# Patient Record
Sex: Female | Born: 1965 | Race: White | Hispanic: No | Marital: Married | Smoking: Never smoker
Health system: Southern US, Community
[De-identification: ages and names within clinical notes are randomized; demographics above are authoritative.]

## PROBLEM LIST (undated history)

## (undated) DIAGNOSIS — E785 Hyperlipidemia, unspecified: Secondary | ICD-10-CM

## (undated) DIAGNOSIS — F419 Anxiety disorder, unspecified: Secondary | ICD-10-CM

## (undated) DIAGNOSIS — F32A Depression, unspecified: Secondary | ICD-10-CM

## (undated) DIAGNOSIS — M549 Dorsalgia, unspecified: Secondary | ICD-10-CM

## (undated) DIAGNOSIS — F329 Major depressive disorder, single episode, unspecified: Secondary | ICD-10-CM

## (undated) DIAGNOSIS — I10 Essential (primary) hypertension: Secondary | ICD-10-CM

## (undated) DIAGNOSIS — R87619 Unspecified abnormal cytological findings in specimens from cervix uteri: Secondary | ICD-10-CM

## (undated) DIAGNOSIS — I2510 Atherosclerotic heart disease of native coronary artery without angina pectoris: Secondary | ICD-10-CM

## (undated) HISTORY — DX: Unspecified abnormal cytological findings in specimens from cervix uteri: R87.619

## (undated) HISTORY — PX: ADENOIDECTOMY: SHX5191

## (undated) HISTORY — DX: Anxiety disorder, unspecified: F41.9

## (undated) HISTORY — DX: Hyperlipidemia, unspecified: E78.5

## (undated) HISTORY — DX: Essential (primary) hypertension: I10

---

## 1998-01-25 ENCOUNTER — Encounter: Admission: RE | Admit: 1998-01-25 | Discharge: 1998-04-25 | Payer: Self-pay | Admitting: *Deleted

## 1998-12-25 DIAGNOSIS — R87619 Unspecified abnormal cytological findings in specimens from cervix uteri: Secondary | ICD-10-CM

## 1998-12-25 HISTORY — DX: Unspecified abnormal cytological findings in specimens from cervix uteri: R87.619

## 1999-01-04 ENCOUNTER — Other Ambulatory Visit: Admission: RE | Admit: 1999-01-04 | Discharge: 1999-01-04 | Payer: Self-pay | Admitting: Obstetrics and Gynecology

## 1999-03-23 ENCOUNTER — Other Ambulatory Visit: Admission: RE | Admit: 1999-03-23 | Discharge: 1999-03-23 | Payer: Self-pay | Admitting: Obstetrics and Gynecology

## 2000-01-31 ENCOUNTER — Other Ambulatory Visit: Admission: RE | Admit: 2000-01-31 | Discharge: 2000-01-31 | Payer: Self-pay | Admitting: Obstetrics and Gynecology

## 2000-10-16 ENCOUNTER — Other Ambulatory Visit: Admission: RE | Admit: 2000-10-16 | Discharge: 2000-10-16 | Payer: Self-pay | Admitting: Obstetrics and Gynecology

## 2001-05-03 ENCOUNTER — Inpatient Hospital Stay (HOSPITAL_COMMUNITY): Admission: AD | Admit: 2001-05-03 | Discharge: 2001-05-03 | Payer: Self-pay | Admitting: Obstetrics and Gynecology

## 2001-05-05 ENCOUNTER — Inpatient Hospital Stay (HOSPITAL_COMMUNITY): Admission: AD | Admit: 2001-05-05 | Discharge: 2001-05-07 | Payer: Self-pay | Admitting: Obstetrics and Gynecology

## 2001-05-10 ENCOUNTER — Encounter: Admission: RE | Admit: 2001-05-10 | Discharge: 2001-06-09 | Payer: Self-pay | Admitting: Obstetrics and Gynecology

## 2001-07-09 ENCOUNTER — Other Ambulatory Visit: Admission: RE | Admit: 2001-07-09 | Discharge: 2001-07-09 | Payer: Self-pay | Admitting: Obstetrics and Gynecology

## 2002-09-10 ENCOUNTER — Other Ambulatory Visit: Admission: RE | Admit: 2002-09-10 | Discharge: 2002-09-10 | Payer: Self-pay | Admitting: Obstetrics and Gynecology

## 2003-09-16 ENCOUNTER — Other Ambulatory Visit: Admission: RE | Admit: 2003-09-16 | Discharge: 2003-09-16 | Payer: Self-pay | Admitting: Obstetrics and Gynecology

## 2004-09-27 ENCOUNTER — Other Ambulatory Visit: Admission: RE | Admit: 2004-09-27 | Discharge: 2004-09-27 | Payer: Self-pay | Admitting: Obstetrics and Gynecology

## 2005-10-26 ENCOUNTER — Encounter: Admission: RE | Admit: 2005-10-26 | Discharge: 2005-10-26 | Payer: Self-pay | Admitting: Obstetrics and Gynecology

## 2006-01-19 ENCOUNTER — Other Ambulatory Visit: Admission: RE | Admit: 2006-01-19 | Discharge: 2006-01-19 | Payer: Self-pay | Admitting: Obstetrics and Gynecology

## 2007-03-25 ENCOUNTER — Other Ambulatory Visit: Admission: RE | Admit: 2007-03-25 | Discharge: 2007-03-25 | Payer: Self-pay | Admitting: Obstetrics and Gynecology

## 2007-06-04 ENCOUNTER — Encounter: Admission: RE | Admit: 2007-06-04 | Discharge: 2007-06-04 | Payer: Self-pay | Admitting: Obstetrics and Gynecology

## 2008-09-07 ENCOUNTER — Emergency Department (HOSPITAL_COMMUNITY): Admission: EM | Admit: 2008-09-07 | Discharge: 2008-09-07 | Payer: Self-pay | Admitting: Emergency Medicine

## 2009-08-19 ENCOUNTER — Encounter: Admission: RE | Admit: 2009-08-19 | Discharge: 2009-08-19 | Payer: Self-pay | Admitting: Obstetrics and Gynecology

## 2010-12-05 ENCOUNTER — Encounter
Admission: RE | Admit: 2010-12-05 | Discharge: 2010-12-05 | Payer: Self-pay | Source: Home / Self Care | Attending: Obstetrics and Gynecology | Admitting: Obstetrics and Gynecology

## 2011-01-14 ENCOUNTER — Encounter: Payer: Self-pay | Admitting: Obstetrics and Gynecology

## 2011-01-15 ENCOUNTER — Encounter: Payer: Self-pay | Admitting: Obstetrics and Gynecology

## 2011-01-16 ENCOUNTER — Encounter: Payer: Self-pay | Admitting: Obstetrics and Gynecology

## 2011-05-12 NOTE — H&P (Signed)
[facility]  Patient:    Gabrielle Ibarra, Gabrielle Ibarra                     MRN: [id number] Adm. Date:  [id number] Disc. Date: [id number] Attending:  Maxie Better                         History and Physical  CHIEF COMPLAINT:                Favorable cervix for induction.  HISTORY OF PRESENT ILLNESS:     [age]-year-old white female G3, P2, EDD May 16, 2001, at 76 + weeks with advanced cervical dilatation for AROM induction.  ALLERGIES:                      SULFA.  MEDICATIONS:                    Prenatal vitamins.  PAST OBSTETRIC HISTORY:         In 1996, 9 pound 6 ounce female delivered by primary cesarean section for CPD; 1998 successful VBAC 8 pound 5 ounce female without complications.  PAST MEDICAL HISTORY:           Otherwise remarkable for an abnormal Pap smear with follow-up normal.  History of GBS positivity with second pregnancy. Strong family history of breast cancer, emphysema, superficial thrombophlebitis and hypertension.  PRENATAL LABORATORY DATA:       Remarkable for GBS negativity.  Blood type is O positive, Rh antibody negative.  VDRL nonreactive.  Rubella immune. Hepatitis B surface antigen negative.  HIV nonreactive.  PRENATAL COURSE:                Complicated by preterm cervical change.  PHYSICAL EXAMINATION:  GENERAL:                        She is a well-developed, well-nourished white female in no apparent distress.  HEENT:                          Normal.  LUNGS:                          Clear.  HEART:                          Regular rhythm.  ABDOMEN:                         Soft, gravid and nontender.  Estimated fetal weight on ultrasound 10 days ago 7 pounds 2 ounces.  PELVIC:                         Cervix is 4 cm, 50%, vertex, -2.  Artificial rupture of membranes clear.  EXTREMITIES:                    Intact.  NEUROLOGICAL:                   Exam is nonfocal.  IMPRESSION:                     1.  Intrauterine pregnancy at 38+ weeks.  2. Advanced cervical dilatation.                                 3. Previous cesarean section; desires vaginal                                    birth after cesarean.  PLAN:                           Risks, benefits of repeat C-section, a 1-3% risk of uterine scar dehiscence, up to 5% with Pitocin augmentation is discussed with the patient.  She acknowledges and desires to proceed with attempts at [facility].  History of group beta strep positivity with group beta strep negative this pregnancy.  The patient is offered treatment versus expectant management due to GBS negative treatment.  She desires no antibiotic treatment.  Advanced maternal age; declined amniocentesis.  Artificial rupture of membranes, Pitocin p.r.n.  Anticipate attempt at vaginal delivery.  Epidural as needed. DD:  05/05/01 TD:  05/05/01 Job: [id number]

## 2011-09-27 LAB — COMPREHENSIVE METABOLIC PANEL
ALT: 16
AST: 20
Albumin: 4.2
Alkaline Phosphatase: 62
BUN: 12
CO2: 26
Calcium: 9.6
Chloride: 102
Creatinine, Ser: 0.65
GFR calc Af Amer: >60
GFR calc non Af Amer: >60
Glucose, Bld: 105 — ABNORMAL HIGH
Potassium: 3.9
Sodium: 136
Total Bilirubin: 1
Total Protein: 7.1

## 2011-09-27 LAB — URINE MICROSCOPIC-ADD ON

## 2011-09-27 LAB — CK TOTAL AND CKMB (NOT AT ARMC)
CK, MB: 1.1
Relative Index: INVALID
Total CK: 92

## 2011-09-27 LAB — PREGNANCY, URINE: Preg Test, Ur: NEGATIVE

## 2011-09-27 LAB — CBC
HCT: 41.6
Hemoglobin: 14.1
MCHC: 33.9
MCV: 89.9
Platelets: 251
RBC: 4.62
RDW: 12.1
WBC: 9.1

## 2011-09-27 LAB — DIFFERENTIAL
Basophils Absolute: 0
Basophils Relative: 0
Eosinophils Absolute: 0
Eosinophils Relative: 1
Lymphocytes Relative: 11 — ABNORMAL LOW
Lymphs Abs: 1
Monocytes Absolute: 0.6
Monocytes Relative: 6
Neutro Abs: 7.4
Neutrophils Relative %: 82 — ABNORMAL HIGH

## 2011-09-27 LAB — TROPONIN I: Troponin I: 0.02

## 2011-09-27 LAB — URINALYSIS, ROUTINE W REFLEX MICROSCOPIC
Bilirubin Urine: NEGATIVE
Glucose, UA: NEGATIVE
Hgb urine dipstick: NEGATIVE
Ketones, ur: NEGATIVE
Nitrite: NEGATIVE
Protein, ur: NEGATIVE
Specific Gravity, Urine: 1.021
Urobilinogen, UA: 0.2
pH: 7

## 2011-12-26 DIAGNOSIS — I10 Essential (primary) hypertension: Secondary | ICD-10-CM

## 2011-12-26 HISTORY — DX: Essential (primary) hypertension: I10

## 2012-02-12 ENCOUNTER — Other Ambulatory Visit: Payer: Self-pay | Admitting: Obstetrics and Gynecology

## 2012-02-12 DIAGNOSIS — Z1231 Encounter for screening mammogram for malignant neoplasm of breast: Secondary | ICD-10-CM

## 2012-02-14 ENCOUNTER — Ambulatory Visit
Admission: RE | Admit: 2012-02-14 | Discharge: 2012-02-14 | Disposition: A | Discharge status: Home / Self Care | Payer: BC Managed Care – PPO | Source: Ambulatory Visit | Attending: Obstetrics and Gynecology | Admitting: Obstetrics and Gynecology

## 2012-02-14 DIAGNOSIS — Z1231 Encounter for screening mammogram for malignant neoplasm of breast: Secondary | ICD-10-CM

## 2012-04-03 HISTORY — PX: INTRAUTERINE DEVICE INSERTION: SHX323

## 2012-04-11 ENCOUNTER — Emergency Department (HOSPITAL_COMMUNITY): Payer: BC Managed Care – PPO

## 2012-04-11 ENCOUNTER — Encounter (HOSPITAL_COMMUNITY): Payer: Self-pay | Admitting: *Deleted

## 2012-04-11 ENCOUNTER — Emergency Department (HOSPITAL_COMMUNITY)
Admission: EM | Admit: 2012-04-11 | Discharge: 2012-04-11 | Disposition: A | Discharge status: Home / Self Care | Payer: BC Managed Care – PPO | Attending: Emergency Medicine | Admitting: Emergency Medicine

## 2012-04-11 DIAGNOSIS — F3289 Other specified depressive episodes: Secondary | ICD-10-CM | POA: Insufficient documentation

## 2012-04-11 DIAGNOSIS — R5381 Other malaise: Secondary | ICD-10-CM | POA: Insufficient documentation

## 2012-04-11 DIAGNOSIS — R5383 Other fatigue: Secondary | ICD-10-CM | POA: Insufficient documentation

## 2012-04-11 DIAGNOSIS — R61 Generalized hyperhidrosis: Secondary | ICD-10-CM | POA: Insufficient documentation

## 2012-04-11 DIAGNOSIS — F329 Major depressive disorder, single episode, unspecified: Secondary | ICD-10-CM | POA: Insufficient documentation

## 2012-04-11 DIAGNOSIS — R51 Headache: Secondary | ICD-10-CM | POA: Insufficient documentation

## 2012-04-11 DIAGNOSIS — R079 Chest pain, unspecified: Secondary | ICD-10-CM | POA: Insufficient documentation

## 2012-04-11 DIAGNOSIS — R0602 Shortness of breath: Secondary | ICD-10-CM | POA: Insufficient documentation

## 2012-04-11 DIAGNOSIS — Z79899 Other long term (current) drug therapy: Secondary | ICD-10-CM | POA: Insufficient documentation

## 2012-04-11 DIAGNOSIS — R000 Tachycardia, unspecified: Secondary | ICD-10-CM | POA: Insufficient documentation

## 2012-04-11 HISTORY — DX: Depression, unspecified: F32.A

## 2012-04-11 HISTORY — DX: Dorsalgia, unspecified: M54.9

## 2012-04-11 HISTORY — DX: Major depressive disorder, single episode, unspecified: F32.9

## 2012-04-11 LAB — POCT I-STAT, CHEM 8
Creatinine, Ser: 0.7 mg/dL (ref 0.50–1.10)
Glucose, Bld: 108 mg/dL — ABNORMAL HIGH (ref 70–99)
HCT: 42 % (ref 36.0–46.0)
Hemoglobin: 14.3 g/dL (ref 12.0–15.0)
Sodium: 141 mEq/L (ref 135–145)
TCO2: 25 mmol/L (ref 0–100)

## 2012-04-11 LAB — CBC
HCT: 42 % (ref 36.0–46.0)
Hemoglobin: 13.8 g/dL (ref 12.0–15.0)
MCH: 30.1 pg (ref 26.0–34.0)
MCV: 91.5 fL (ref 78.0–100.0)
Platelets: 246 10*3/uL (ref 150–400)
RBC: 4.59 MIL/uL (ref 3.87–5.11)
RDW: 12.1 % (ref 11.5–15.5)
WBC: 6.1 10*3/uL (ref 4.0–10.5)

## 2012-04-11 MED ORDER — ASPIRIN 81 MG PO CHEW
324.0000 mg | CHEWABLE_TABLET | Freq: Once | ORAL | Status: AC
  Administered 2012-04-11: 324 mg via ORAL
  Filled 2012-04-11: qty 4

## 2012-04-11 MED ORDER — SODIUM CHLORIDE 0.9 % IV SOLN: 20.0000 mL | INTRAVENOUS | Status: DC

## 2012-04-11 MED ORDER — OMEPRAZOLE 20 MG PO CPDR: 40.0000 mg | DELAYED_RELEASE_CAPSULE | Freq: Every day | ORAL | Status: DC

## 2012-04-11 MED ORDER — GI COCKTAIL ~~LOC~~
30.0000 mL | Freq: Once | ORAL | Status: AC
  Administered 2012-04-11: 30 mL via ORAL
  Filled 2012-04-11: qty 30

## 2012-04-11 NOTE — ED Notes (Signed)
Reports having 2 glasses of wine last night, followed by fatigue. Felt like her heart was racing last night before bed, wke up this am with chest heaviness, sob and headache. No acute distress noted at triage, ekg being done.

## 2012-04-11 NOTE — Discharge Instructions (Signed)
Chest Pain (Nonspecific) It is often hard to give a specific diagnosis for the cause of chest pain. There is always a chance that your pain could be related to something serious, such as a heart attack or a blood clot in the lungs. You need to follow up with your caregiver for further evaluation. CAUSES   Heartburn.   Pneumonia or bronchitis.   Anxiety or stress.   Inflammation around your heart (pericarditis) or lung (pleuritis or pleurisy).   A blood clot in the lung.   A collapsed lung (pneumothorax). It can develop suddenly on its own (spontaneous pneumothorax) or from injury (trauma) to the chest.   Shingles infection (herpes zoster virus).  The chest wall is composed of bones, muscles, and cartilage. Any of these can be the source of the pain.  The bones can be bruised by injury.   The muscles or cartilage can be strained by coughing or overwork.   The cartilage can be affected by inflammation and become sore (costochondritis).  DIAGNOSIS  Lab tests or other studies, such as X-rays, electrocardiography, stress testing, or cardiac imaging, may be needed to find the cause of your pain.  TREATMENT   Treatment depends on what may be causing your chest pain. Treatment may include:   Acid blockers for heartburn.   Anti-inflammatory medicine.   Pain medicine for inflammatory conditions.   Antibiotics if an infection is present.   You may be advised to change lifestyle habits. This includes stopping smoking and avoiding alcohol, caffeine, and chocolate.   You may be advised to keep your head raised (elevated) when sleeping. This reduces the chance of acid going backward from your stomach into your esophagus.   Most of the time, nonspecific chest pain will improve within 2 to 3 days with rest and mild pain medicine.  HOME CARE INSTRUCTIONS   If antibiotics were prescribed, take your antibiotics as directed. Finish them even if you start to feel better.   For the next few  days, avoid physical activities that bring on chest pain. Continue physical activities as directed.   Do not smoke.   Avoid drinking alcohol.   Only take over-the-counter or prescription medicine for pain, discomfort, or fever as directed by your caregiver.   Follow your caregiver's suggestions for further testing if your chest pain does not go away.   Keep any follow-up appointments you made. If you do not go to an appointment, you could develop lasting (chronic) problems with pain. If there is any problem keeping an appointment, you must call to reschedule.  SEEK MEDICAL CARE IF:   You think you are having problems from the medicine you are taking. Read your medicine instructions carefully.   Your chest pain does not go away, even after treatment.   You develop a rash with blisters on your chest.  SEEK IMMEDIATE MEDICAL CARE IF:   You have increased chest pain or pain that spreads to your arm, neck, jaw, back, or abdomen.   You develop shortness of breath, an increasing cough, or you are coughing up blood.   You have severe back or abdominal pain, feel nauseous, or vomit.   You develop severe weakness, fainting, or chills.   You have a fever.  THIS IS AN EMERGENCY. Do not wait to see if the pain will go away. Get medical help at once. Call your local emergency services (911 in [place] Do not drive yourself to the hospital. MAKE SURE YOU:   Understand these instructions.     Will watch your condition.   Will get help right away if you are not doing well or get worse.  Document Released: 09/20/2005 Document Revised: 11/30/2011 Document Reviewed: 07/16/2008 [employer] Patient Information 2012 [employer]. 

## 2012-04-11 NOTE — ED Provider Notes (Signed)
History     CSN: [id number]  Arrival date & time 04/11/12  1145   First MD Initiated Contact with Patient 04/11/12 1200      Chief Complaint  Patient presents with  . Chest Pain     HPI The patient presents to the emergency room with complaints of chest discomfort that started last evening. Patient states she felt somewhat fatigued last night. She also felt like her heart was racing. Patient did feel diaphoretic last night. She woke up in the a.m. with the sensation of heaviness in her chest, shortness of breath and some headache. The patient states the symptoms are moderate. Nothing seems to make it better or worse .  She called her primary care doctor who suggested she come to the emergency room for evaluation. Patient states she has not had trouble like this in the past. She does have episodes of recurrent back pain and currently is on a prednisone pack.  She has no known history of heart or lung disease. There is no history of heart or lung disease in immediate family although both of her grandfathers did have coronary artery disease. Past Medical History  Diagnosis Date  . Depression   . Back pain     History reviewed. No pertinent past surgical history.  History reviewed. No pertinent family history.  History  Substance Use Topics  . Smoking status: Not on file  . Smokeless tobacco: Not on file  . Alcohol Use: Yes     occ    OB History    Grav Para Term Preterm Abortions TAB SAB Ect Mult Living                  Review of Systems  All other systems reviewed and are negative.    Allergies  Sulfa antibiotics  Home Medications   Current Outpatient Rx  Name Route Sig Dispense Refill  . CETIRIZINE HCL 10 MG PO TABS Oral Take 10 mg by mouth daily as needed. For allergies.    Marland Kitchen PREDNISONE (PAK) 10 MG PO TABS Oral Take 10-60 mg by mouth daily. Day 5 of 6-day dosepak.    . SERTRALINE HCL 50 MG PO TABS Oral Take 25 mg by mouth daily.      BP 143/105  Pulse 75   Temp(Src) 98.4 F (36.9 C) (Oral)  Resp 18  SpO2 97%  Physical Exam  Nursing note and vitals reviewed. Constitutional: She appears well-developed and well-nourished. No distress.  HENT:  Head: Normocephalic and atraumatic.  Right Ear: External ear normal.  Left Ear: External ear normal.  Eyes: Conjunctivae are normal. Right eye exhibits no discharge. Left eye exhibits no discharge. No scleral icterus.  Neck: Neck supple. No tracheal deviation present.  Cardiovascular: Normal rate, regular rhythm and intact distal pulses.   Pulmonary/Chest: Effort normal and breath sounds normal. No stridor. No respiratory distress. She has no wheezes. She has no rales.  Abdominal: Soft. Bowel sounds are normal. She exhibits no distension. There is no tenderness. There is no rebound and no guarding.  Musculoskeletal: She exhibits no edema and no tenderness.  Neurological: She is alert. She has normal strength. No sensory deficit. Cranial nerve deficit:  no gross defecits noted. She exhibits normal muscle tone. She displays no seizure activity. Coordination normal.  Skin: Skin is warm and dry. No rash noted.  Psychiatric: She has a normal mood and affect.    ED Course  Procedures (including critical care time)  Rate: 74  Rhythm: normal  sinus rhythm  QRS Axis: normal  Intervals: normal  ST/T Wave abnormalities: normal  Conduction Disutrbances:none  Narrative Interpretation:   Old EKG Reviewed: none available  Labs Reviewed  POCT I-STAT, CHEM 8 - Abnormal; Notable for the following:    Glucose, Bld 108 (*)    All other components within normal limits  CBC  POCT I-STAT TROPONIN I   Dg Chest 2 View  04/11/2012  *RADIOLOGY REPORT*  Clinical Data: Chest pain and shortness of breath.  CHEST - 2 VIEW  Comparison: 09/07/2008.  Findings: Trachea is midline.  Heart size normal.  Lungs are clear. No pleural fluid.  IMPRESSION: No acute findings.  Original Report Authenticated By: Reyes Ivan, M.D.       1. Chest pain       MDM  The patient is low risk for CAD.  She has a reassuring workup in the ED.  No risk factors for PE.  NO tachycardia and no tachypnea.  Doubt ACS, PE.  GERD may be a possibility with her recent steroid use.  Will dc home on antacids.  Precautions given.  Pt to follow up with PCP next week.       Celene Kras, MD 04/11/12 1352

## 2012-05-09 ENCOUNTER — Ambulatory Visit: Payer: BC Managed Care – PPO

## 2012-06-11 ENCOUNTER — Ambulatory Visit: Payer: BC Managed Care – PPO | Admitting: Physical Therapy

## 2012-06-12 ENCOUNTER — Other Ambulatory Visit: Payer: Self-pay | Admitting: Obstetrics and Gynecology

## 2012-06-12 ENCOUNTER — Ambulatory Visit
Admission: RE | Admit: 2012-06-12 | Discharge: 2012-06-12 | Disposition: A | Discharge status: Home / Self Care | Payer: BC Managed Care – PPO | Source: Ambulatory Visit | Attending: Obstetrics and Gynecology | Admitting: Obstetrics and Gynecology

## 2012-06-12 ENCOUNTER — Ambulatory Visit: Admission: RE | Admit: 2012-06-12 | Payer: BC Managed Care – PPO | Source: Ambulatory Visit

## 2012-06-12 DIAGNOSIS — Z1231 Encounter for screening mammogram for malignant neoplasm of breast: Secondary | ICD-10-CM

## 2012-06-12 LAB — HM MAMMOGRAPHY: HM MAMMO: NEGATIVE

## 2012-06-20 ENCOUNTER — Ambulatory Visit: Payer: BC Managed Care – PPO | Admitting: Physical Therapy

## 2013-01-10 LAB — HM PAP SMEAR: HM PAP: NEGATIVE

## 2014-01-16 ENCOUNTER — Ambulatory Visit: Payer: Self-pay | Admitting: Nurse Practitioner

## 2014-02-12 ENCOUNTER — Encounter: Payer: Self-pay | Admitting: Nurse Practitioner

## 2014-02-13 ENCOUNTER — Encounter: Payer: Self-pay | Admitting: Nurse Practitioner

## 2014-02-13 ENCOUNTER — Ambulatory Visit (INDEPENDENT_AMBULATORY_CARE_PROVIDER_SITE_OTHER): Payer: BC Managed Care – PPO | Admitting: Nurse Practitioner

## 2014-02-13 VITALS — BP 118/82 | HR 84 | Ht 66.0 in | Wt 173.0 lb

## 2014-02-13 DIAGNOSIS — Z733 Stress, not elsewhere classified: Secondary | ICD-10-CM

## 2014-02-13 DIAGNOSIS — Z01419 Encounter for gynecological examination (general) (routine) without abnormal findings: Secondary | ICD-10-CM

## 2014-02-13 DIAGNOSIS — M25529 Pain in unspecified elbow: Secondary | ICD-10-CM

## 2014-02-13 DIAGNOSIS — F439 Reaction to severe stress, unspecified: Secondary | ICD-10-CM

## 2014-02-13 DIAGNOSIS — R6882 Decreased libido: Secondary | ICD-10-CM

## 2014-02-13 DIAGNOSIS — B373 Candidiasis of vulva and vagina: Secondary | ICD-10-CM

## 2014-02-13 DIAGNOSIS — B3731 Acute candidiasis of vulva and vagina: Secondary | ICD-10-CM

## 2014-02-13 MED ORDER — NONFORMULARY OR COMPOUNDED ITEM: Status: DC

## 2014-02-13 MED ORDER — NYSTATIN-TRIAMCINOLONE 100000-0.1 UNIT/GM-% EX OINT: 1.0000 "application " | TOPICAL_OINTMENT | Freq: Two times a day (BID) | CUTANEOUS | Status: DC

## 2014-02-13 MED ORDER — ZOLPIDEM TARTRATE 10 MG PO TABS: 10.0000 mg | ORAL_TABLET | Freq: Every evening | ORAL | Status: DC | PRN

## 2014-02-13 NOTE — Progress Notes (Signed)
Patient ID: Gabrielle Ibarra, female   DOB: 1966-03-27, 48 y.o.   MRN: [id number] 48 y.o. [id number] Married Caucasian Fe here for annual exam.  She has several concerns.  She has an irritation around the introitus that sometimes burns and feels itching without discharge.  Does not seen to be related to changes in soaps or personal products.  She also has a decreased libido.  During this past year her husband lost his job and she had to go back to work.  That stress led to some family and marital issues that are some better with her husbands returning to work.  She has 3 teenagers that also keep her busy.  She is concerned that now that she and her husband are working things out that she wants that part of the relationship to be good as well.  Finally, she has had a pain or achy sensation under her left upper biceps.  Seems to be achy and non radiating.  Not related to exercise and no associated chest pain.  She saw NP at her PCP and all test was negative.  She is scheduled to see PCP next week. Interestingly, after the exam with the NP all the pain is gone. Now on antibiotics for sinusitis.  No LMP recorded. Patient is not currently having periods (Reason: IUD). Mirena IUD 04/03/12           Sexually active: yes  The current method of family planning is IUD.    Exercising: yes  Gym/ health club routine includes zumba and walking 1-2 times per week. Smoker:  no  Health Maintenance: Pap:  01/10/13, WNL, neg HR HPV MMG:  06/12/12, Bi-Rads 1: negative TDaP:  2014 Labs:  PCP   reports that she has never smoked. She has never used smokeless tobacco. She reports that she drinks alcohol. She reports that she does not use illicit drugs.  Past Medical History  Diagnosis Date  . Depression   . Back pain   . Hyperlipidemia   . Anxiety   . Hypertension 2013    Past Surgical History  Procedure Laterality Date  . Cesarean section      x 1  . Adenoidectomy    . Intrauterine device insertion  04/03/12     Mirena    Current Outpatient Prescriptions  Medication Sig Dispense Refill  . ALPRAZolam (XANAX) 0.5 MG tablet Take 0.5 mg by mouth at bedtime as needed for anxiety.      . Azelaic Acid (FINACEA) 15 % cream Apply topically 2 (two) times daily. After skin is thoroughly washed and patted dry, gently but thoroughly massage a thin film of azelaic acid cream into the affected area twice daily, in the morning and evening.      . cefdinir (OMNICEF) 300 MG capsule Take 300 mg by mouth 2 (two) times daily.      Marland Kitchen. losartan (COZAAR) 50 MG tablet Take 50 mg by mouth daily.      . NONFORMULARY OR COMPOUNDED ITEM Testosterone propianate 2 % in white petrolatum 60 gm apply pea size amount to posterior fourchet bid for 6 weeks then daily as directed.  60 each  1  . nystatin-triamcinolone ointment (MYCOLOG) Apply 1 application topically 2 (two) times daily.  30 g  0  . zolpidem (AMBIEN) 10 MG tablet Take 1 tablet (10 mg total) by mouth at bedtime as needed for sleep.  30 tablet  2   No current facility-administered medications for this visit.    Family History  Problem Relation Age of Onset  . Breast cancer Mother 89  . Hyperlipidemia Mother   . Hypertension Mother   . Hyperlipidemia Father   . Hypertension Father     ROS:  Pertinent items are noted in HPI.  Otherwise, a comprehensive ROS was negative.  Exam:   BP 118/82  Pulse 84  Ht 5\' 6"  (1.676 m)  Wt 173 lb (78.472 kg)  BMI 27.94 kg/m2 Height: 5\' 6"  (167.6 cm)  Ht Readings from Last 3 Encounters:  02/13/14 5\' 6"  (1.676 m)    General appearance: alert, cooperative and appears stated age Head: Normocephalic, without obvious abnormality, atraumatic. She has maxillary sinus pain and pressure from sinusitis. Neck: no adenopathy, supple, symmetrical, trachea midline and thyroid normal to inspection and palpation Lungs: clear to auscultation bilaterally Breasts: normal appearance, no masses or tenderness Heart: regular rate and rhythm Abdomen:  soft, non-tender; no masses,  no organomegaly Extremities: extremities normal, atraumatic, no cyanosis or edema Skin: Skin color, texture, turgor normal. No rashes or lesions Lymph nodes: Cervical, supraclavicular, and axillary nodes normal. No abnormal inguinal nodes palpated Neurologic: Grossly normal   Pelvic: External genitalia:  no lesions, there is a redness and irritation at 6:00 position of the introitus.  Seems to be related to irritation.  Also is vulvar atrophy.              Urethra:  normal appearing urethra with no masses, tenderness or lesions              Bartholin's and Skene's: normal                 Vagina: normal appearing vagina with normal color and discharge, no lesions              Cervix: anteverted              Pap taken: no Bimanual Exam:  Uterus:  normal size, contour, position, consistency, mobility, non-tender              Adnexa: no mass, fullness, tenderness               Rectovaginal: Confirms               Anus:  normal sphincter tone, no lesions  A:  Well Woman with normal exam  Mirena IUD for contraception  Loss of libido  Family stressors  History of anxiety and depression  Current sinusitis and unusual pain under left bicep  Vulvar irritation maybe related to yeast  P:   Pap smear as per guidelines   Mammogram is past due and will schedule  Discussion about perimenopause and estrogen effects on libido.  Discussed various herbal and pharmaceutical treatment options.  Also discussed spending quality time together and making a date night.  She is ready to try Testosterone cream and see if it makes a difference.  She is aware to return in 8 weeks for a testosterone lab test.  Refill on Ambiem which only uses rarely  Rx for Triamcinolone and Nystatin for external yeast symptoms - to let us know if this is not helpful  Counseled on breast self exam, mammography screening, adequate intake of calcium and vitamin D, diet and exercise, Kegel's  exercises return annually or prn  An After Visit Summary was printed and given to the patient.

## 2014-02-13 NOTE — Patient Instructions (Signed)

## 2014-02-15 NOTE — Progress Notes (Signed)
Encounter reviewed by Dr. Brook Silva.  

## 2014-02-19 ENCOUNTER — Other Ambulatory Visit: Payer: Self-pay

## 2014-02-19 DIAGNOSIS — Z1231 Encounter for screening mammogram for malignant neoplasm of breast: Secondary | ICD-10-CM

## 2014-03-06 ENCOUNTER — Ambulatory Visit
Admission: RE | Admit: 2014-03-06 | Discharge: 2014-03-06 | Disposition: A | Discharge status: Home / Self Care | Payer: BC Managed Care – PPO | Source: Ambulatory Visit

## 2014-03-06 DIAGNOSIS — Z1231 Encounter for screening mammogram for malignant neoplasm of breast: Secondary | ICD-10-CM

## 2014-07-20 ENCOUNTER — Other Ambulatory Visit: Payer: Self-pay | Admitting: Nurse Practitioner

## 2014-07-21 NOTE — Telephone Encounter (Signed)
Last AEX: 02/13/14 Last refill:02/13/14 #30, 2 rfs Current AEX:02/19/15  Please advise

## 2014-10-26 ENCOUNTER — Encounter: Payer: Self-pay | Admitting: Nurse Practitioner

## 2015-01-29 ENCOUNTER — Other Ambulatory Visit: Payer: Self-pay

## 2015-01-29 DIAGNOSIS — Z1231 Encounter for screening mammogram for malignant neoplasm of breast: Secondary | ICD-10-CM

## 2015-02-19 ENCOUNTER — Ambulatory Visit: Payer: BC Managed Care – PPO | Admitting: Nurse Practitioner

## 2015-03-12 ENCOUNTER — Ambulatory Visit (INDEPENDENT_AMBULATORY_CARE_PROVIDER_SITE_OTHER): Payer: BLUE CROSS/BLUE SHIELD | Admitting: Nurse Practitioner

## 2015-03-12 ENCOUNTER — Encounter: Payer: Self-pay | Admitting: Nurse Practitioner

## 2015-03-12 VITALS — BP 116/80 | HR 76 | Resp 18 | Ht 65.75 in | Wt 168.0 lb

## 2015-03-12 DIAGNOSIS — Z01419 Encounter for gynecological examination (general) (routine) without abnormal findings: Secondary | ICD-10-CM

## 2015-03-12 NOTE — Patient Instructions (Addendum)

## 2015-03-12 NOTE — Progress Notes (Signed)
49 y.o. [id number] Married  Caucasian Fe here for annual exam. Recent problems with Rosacea and taking Doxycycline 100 mg.  She is trying to taper down to taking every 2-3 days and still maintain control of symptoms.    Patient's last menstrual period was 10/31/2006.          Sexually active: Yes.    The current method of family planning is IUD - Mirena IUD 04/03/12   Exercising: Yes.    Walking 0 - 4 x weekly Smoker:  no  Health Maintenance: Pap: 12/2012 Neg. HR HPV:Neg Hx of abnormal pap 2000 ASCUS MMG:  03/09/14 BIRADS1:neg Self Breast Check: yes, monthly Colonoscopy: Never  She will check insurance coverage for MD in her plan TDaP: 2014 Labs: PCP - Dr. Timothy Lassousso   reports that she has never smoked. She has never used smokeless tobacco. She reports that she drinks alcohol. She reports that she does not use illicit drugs.  Past Medical History  Diagnosis Date  . Depression   . Back pain   . Hyperlipidemia   . Anxiety   . Hypertension 2013  . Abnormal Pap smear of cervix 2000    ASCUS    Past Surgical History  Procedure Laterality Date  . Cesarean section      x 1  . Adenoidectomy    . Intrauterine device insertion  04/03/12    Mirena    Current Outpatient Prescriptions  Medication Sig Dispense Refill  . ALPRAZolam (XANAX) 0.5 MG tablet Take 0.5 mg by mouth at bedtime as needed for anxiety.    Marland Kitchen. doxycycline (VIBRAMYCIN) 100 MG capsule   1  . FINACEA 15 % FOAM   2  . losartan (COZAAR) 50 MG tablet Take 50 mg by mouth daily.    Marland Kitchen. zolpidem (AMBIEN) 10 MG tablet TAKE 1 TABLET BY MOUTH AT BEDTIME AS NEEDED FOR SLEEP 30 tablet 5   No current facility-administered medications for this visit.    Family History  Problem Relation Age of Onset  . Breast cancer Mother 5055  . Hyperlipidemia Mother   . Hypertension Mother   . Hyperlipidemia Father   . Hypertension Father     ROS:  Pertinent items are noted in HPI.  Otherwise, a comprehensive ROS was negative.  Exam:   BP 116/80  mmHg  Pulse 76  Resp 18  Ht 5' 5.75" (1.67 m)  Wt 168 lb (76.204 kg)  BMI 27.32 kg/m2  LMP 10/31/2006 Height: 5' 5.75" (167 cm) Ht Readings from Last 3 Encounters:  03/12/15 5' 5.75" (1.67 m)  02/13/14 5\' 6"  (1.676 m)    General appearance: alert, cooperative and appears stated age Head: Normocephalic, without obvious abnormality, atraumatic Neck: no adenopathy, supple, symmetrical, trachea midline and thyroid normal to inspection and palpation Lungs: clear to auscultation bilaterally Breasts: normal appearance, no masses or tenderness, area of superficial skin infection that is clearing under right breast. Heart: regular rate and rhythm Abdomen: soft, non-tender; no masses,  no organomegaly Extremities: extremities normal, atraumatic, no cyanosis or edema Skin: Skin color, texture, turgor normal. No rashes or lesions Lymph nodes: Cervical, supraclavicular, and axillary nodes normal. No abnormal inguinal nodes palpated Neurologic: Grossly normal   Pelvic: External genitalia:  no lesions              Urethra:  normal appearing urethra with no masses, tenderness or lesions              Bartholin's and Skene's: normal  Vagina: normal appearing vagina with normal color and discharge, no lesions              Cervix: anteverted              Pap taken: No. Bimanual Exam:  Uterus:  normal size, contour, position, consistency, mobility, non-tender              Adnexa: no mass, fullness, tenderness               Rectovaginal: Confirms               Anus:  normal sphincter tone, no lesions  Chaperone present: No  A:  Well Woman with normal exam  Mirena IUD for contraception 04/03/12 Loss of libido Family stressors History of anxiety and depression, insomnia  Rosacea  HTN   P:   Reviewed health and wellness pertinent to exam  Pap smear not taken today  Mammogram is due and will get 3 D  Will let us know if GI consult is needed  or will get from Dr. Darl Pikes on breast self exam, mammography screening, adequate intake of calcium and vitamin D, diet and exercise, Kegel's exercises return annually or prn  An After Visit Summary was printed and given to the patient.

## 2015-03-18 NOTE — Progress Notes (Signed)
Encounter reviewed by Dr. Brook Silva.  

## 2015-03-21 ENCOUNTER — Other Ambulatory Visit: Payer: Self-pay | Admitting: Nurse Practitioner

## 2015-03-22 NOTE — Telephone Encounter (Signed)
[facility]

## 2015-03-22 NOTE — Telephone Encounter (Signed)
Medication refill request: Zolpidem 10 mg  Last AEX:  03/12/15 with PG  Next AEX: 03/17/16 with PG Last MMG (if hormonal medication request): N/A Refill authorized: Please advise.

## 2015-03-25 ENCOUNTER — Other Ambulatory Visit: Payer: Self-pay | Admitting: Nurse Practitioner

## 2015-03-25 DIAGNOSIS — Z1231 Encounter for screening mammogram for malignant neoplasm of breast: Secondary | ICD-10-CM

## 2015-04-09 ENCOUNTER — Ambulatory Visit: Payer: Self-pay | Admitting: Nurse Practitioner

## 2015-04-12 ENCOUNTER — Ambulatory Visit (INDEPENDENT_AMBULATORY_CARE_PROVIDER_SITE_OTHER): Payer: BLUE CROSS/BLUE SHIELD | Admitting: Nurse Practitioner

## 2015-04-12 ENCOUNTER — Encounter: Payer: Self-pay | Admitting: Nurse Practitioner

## 2015-04-12 VITALS — BP 122/76 | HR 72 | Temp 98.6°F | Ht 65.75 in | Wt 172.0 lb

## 2015-04-12 DIAGNOSIS — R300 Dysuria: Secondary | ICD-10-CM | POA: Diagnosis not present

## 2015-04-12 LAB — POCT URINALYSIS DIPSTICK
BILIRUBIN UA: NEGATIVE
Glucose, UA: NEGATIVE
Ketones, UA: NEGATIVE
Leukocytes, UA: NEGATIVE
NITRITE UA: NEGATIVE
PH UA: 6
Protein, UA: NEGATIVE
RBC UA: NEGATIVE
Urobilinogen, UA: NEGATIVE

## 2015-04-12 MED ORDER — CIPROFLOXACIN HCL 500 MG PO TABS: 500.0000 mg | ORAL_TABLET | Freq: Two times a day (BID) | ORAL | Status: DC

## 2015-04-12 NOTE — Progress Notes (Signed)
Encounter reviewed by Dr. Manila Rommel Silva.  

## 2015-04-12 NOTE — Patient Instructions (Signed)

## 2015-04-12 NOTE — Progress Notes (Signed)
S:  49 y.o.MW Fe female presents with complaint of UTI. Symptoms began on Friday. With symptoms of dysuria, lower abdominal pressure, pain from the urethra upward. pain radiates from the pubic bone to the coxyc.   Pertinent negatives include having no constitutional symptoms, denying fever, chills, anorexia, or weight loss. Sexually active no secondary to her decrease in libido.  Symptoms not related to post coital. Menopausal yes.   Some vaginal dryness. Same partner without change. Last UTI documented years ago.  ROS: no weight loss, fever, night sweats, feels well and fatigued  O alert, oriented to person, place, and time, normal mood, behavior, speech, dress, motor activity, and thought processes   healthy,  alert,  not in acute distress, well developed and well nourished  Mild suprapubic pain and pressure  No CVA tenderness  pelvic:  cervix normal in appearance, external genitalia normal, no adnexal masses or tenderness, no cervical motion tenderness, vagina normal without discharge and IUD strings are visible   Diagnostic Test:    Urinalysis chemstrip is negative   urine culture is sent with micro  Assessment:  R/O UTI   urethritis    Mirena IUD 04/03/2012  Plan:   Maintain adequate hydration. Follow up if symptoms not improving, and as needed.   Medication Therapy: Cipro 500 mg BID # 14   Lab: If Urine Culture is negative then antibiotics will have treated urethritis    RV

## 2015-04-14 LAB — URINALYSIS, MICROSCOPIC ONLY
Bacteria, UA: NONE SEEN
CASTS: NONE SEEN
CRYSTALS: NONE SEEN
Squamous Epithelial / LPF: NONE SEEN

## 2015-04-15 LAB — URINE CULTURE
COLONY COUNT: NO GROWTH
Organism ID, Bacteria: NO GROWTH

## 2015-04-16 ENCOUNTER — Ambulatory Visit: Payer: Self-pay

## 2015-05-28 ENCOUNTER — Ambulatory Visit
Admission: RE | Admit: 2015-05-28 | Discharge: 2015-05-28 | Disposition: A | Discharge status: Home / Self Care | Payer: BLUE CROSS/BLUE SHIELD | Source: Ambulatory Visit | Attending: Nurse Practitioner | Admitting: Nurse Practitioner

## 2015-05-28 ENCOUNTER — Encounter (INDEPENDENT_AMBULATORY_CARE_PROVIDER_SITE_OTHER): Payer: Self-pay

## 2015-05-28 DIAGNOSIS — Z1231 Encounter for screening mammogram for malignant neoplasm of breast: Secondary | ICD-10-CM

## 2015-10-28 ENCOUNTER — Other Ambulatory Visit: Payer: Self-pay | Admitting: Nurse Practitioner

## 2015-10-29 NOTE — Telephone Encounter (Signed)
[facility]

## 2015-10-29 NOTE — Telephone Encounter (Signed)
Medication refill request: Ambien  Last AEX:  03/12/15 PG Next AEX: 03/17/16 PG Last MMG (if hormonal medication request): 05/31/15 BIRADS1:neg Refill authorized: 03/22/15 #[id number]/5R. Today please advise.

## 2015-11-08 ENCOUNTER — Telehealth: Payer: Self-pay | Admitting: Nurse Practitioner

## 2015-11-08 NOTE — Telephone Encounter (Signed)
Spoke with patient. Patient states over the last couple of months she has noticed a place in her left breast that is tender to the touch. Denies any swelling or redness to the breast. States she has had a "normal" mammogram this year. Would like to be seen for a breast check. Appointment scheduled for tomorrow 11/15 at 12:45 pm with [name], [name]. Agreeable to date and time.  Routing to provider for final review. Patient agreeable to disposition. Will close encounter.

## 2015-11-08 NOTE — Telephone Encounter (Signed)
Patient is having some issues with her breast. Patient would like an appointment late this week or next week. Patient said " I have already had a MMG, just want someone to take a look". No further details given.

## 2015-11-09 ENCOUNTER — Ambulatory Visit (INDEPENDENT_AMBULATORY_CARE_PROVIDER_SITE_OTHER): Payer: BLUE CROSS/BLUE SHIELD | Admitting: Nurse Practitioner

## 2015-11-09 ENCOUNTER — Encounter: Payer: Self-pay | Admitting: Nurse Practitioner

## 2015-11-09 VITALS — BP 144/90 | HR 80 | Ht 67.5 in | Wt 174.0 lb

## 2015-11-09 DIAGNOSIS — N63 Unspecified lump in breast: Secondary | ICD-10-CM | POA: Diagnosis not present

## 2015-11-09 DIAGNOSIS — N6320 Unspecified lump in the left breast, unspecified quadrant: Secondary | ICD-10-CM

## 2015-11-09 NOTE — Progress Notes (Signed)
   Subjective:   49 y.o. Married Caucasian female G3P3 presents for evaluation of left breast mass. Onset of the symptoms was several months when her husband was playing with her and grabbed at her breast. Patient sought evaluation because of breast tenderness that remains but it is intermittent.  No relationship to caffeine.    LMP: 10/31/2006 with Mirena IUD in place. Contributing factors include mom with breast CA. Denies anorexia, chills, fatigue, fevers, malaise and night sweats. Patient denies history of trauma, bites, or injuries. Last mammogram was: 05/28/2015 3D with scattered fibroglandular density w/o mass.  Previous evaluation has included mammograms only.   Review of Systems Pertinent items noted in HPI and remainder of comprehensive ROS otherwise negative.SUBJECTIVE   Objective:   General appearance: alert, cooperative, appears stated age and no distress Head: Normocephalic, without obvious abnormality, atraumatic Neck: no adenopathy, supple, symmetrical, trachea midline and thyroid not enlarged, symmetric, no tenderness/mass/nodules Back: symmetric, no curvature. ROM normal. No CVA tenderness. Lungs: clear to auscultation bilaterally Breasts: normal appearance, no masses or tenderness, positive findings: fibrocystic changes and tenderness about 3 inches from the left areola at 8-9:00 position Heart: regular rate and rhythm Abdomen: normal findings: no masses palpable and soft, non-tender    Assessment:   ASSESSMENT:Patient is diagnosed with fibrocystic changes and mastalgia   Plan:   PLAN: The patient has a documented plan to follow with further care of  on  11/16/15 with US and diagnostic Mammo. 2. PLAN: FOLLOW as needed

## 2015-11-09 NOTE — Progress Notes (Signed)
Left breast diagnostic with left breast ultrasound [facility] Patient is agreeable to date and time.

## 2015-11-14 NOTE — Progress Notes (Signed)
Encounter reviewed by Dr. Brook Amundson C. Silva.  

## 2015-11-16 ENCOUNTER — Ambulatory Visit
Admission: RE | Admit: 2015-11-16 | Discharge: 2015-11-16 | Disposition: A | Discharge status: Home / Self Care | Payer: BLUE CROSS/BLUE SHIELD | Source: Ambulatory Visit | Attending: Nurse Practitioner | Admitting: Nurse Practitioner

## 2015-11-16 ENCOUNTER — Other Ambulatory Visit: Payer: Self-pay | Admitting: Nurse Practitioner

## 2015-11-16 DIAGNOSIS — N6320 Unspecified lump in the left breast, unspecified quadrant: Secondary | ICD-10-CM

## 2015-11-23 ENCOUNTER — Telehealth: Payer: Self-pay | Admitting: Emergency Medicine

## 2015-11-23 NOTE — Telephone Encounter (Signed)
-----   Message from [name], [name] sent at 11/16/2015  5:31 PM EST ----- Please call patient and have her to return in 6-8 weeks for a breast recheck.  Mammo was normal. Out of hold

## 2015-11-23 NOTE — Telephone Encounter (Signed)
Spoke with patient and message from [name], [name] given. Patient agreeable to follow up breast check, but wishes to schedule in February due to insurance changes.  Appointment scheduled for 01/27/15 with [name], [name] for breast check and patient will continue to complete self breast exams.   Dr. Edward JollySilva, okay to remove from hold?

## 2015-11-23 NOTE — Telephone Encounter (Signed)
Out of hold per Dr. Silva   

## 2015-11-23 NOTE — Telephone Encounter (Signed)
OK to remove from mammogram hold and return to routine screening.  Keep recheck appointment.

## 2016-01-18 ENCOUNTER — Telehealth: Payer: Self-pay | Admitting: Nurse Practitioner

## 2016-01-18 NOTE — Telephone Encounter (Signed)
LMTCB about cx appt/rd °

## 2016-01-26 ENCOUNTER — Telehealth: Payer: Self-pay | Admitting: Nurse Practitioner

## 2016-01-26 NOTE — Telephone Encounter (Signed)
Patient canceled her appointment 01/28/16 "breast check" via automated reminder call. I left patient a message to call and reschedule.

## 2016-01-26 NOTE — Telephone Encounter (Signed)
She needs to stay in breast recall until apt.

## 2016-01-28 ENCOUNTER — Ambulatory Visit: Payer: BLUE CROSS/BLUE SHIELD | Admitting: Nurse Practitioner

## 2016-01-31 NOTE — Telephone Encounter (Signed)
Call to patient. She declines to schedule breast check. She states that her symptoms have "completely, completely, resolved."  She verbalizes importance of follow up, but declines to schedule at this time.  She was scheduled for annual exam with Ria Comment, [name] on 03/17/16 but that was cancelled due to provider schedule.  Patient would like to call back to reschedule annual exam and she will have breast recheck with annual exam.    Update to Ria Comment, [name] and Dr. Edward Jolly for review.

## 2016-01-31 NOTE — Telephone Encounter (Signed)
I would like for Shirlyn Goltz to review the patient's care.  If she had a discrete lump on exam, then she needs a breast recheck before her annual exam is due later this spring.  If there was not discrete lump, I am OK with her not doing a breast recheck and waiting until the annual exam.   Cc- Shirlyn Goltz

## 2016-02-08 NOTE — Telephone Encounter (Signed)
Since there was no discrete lump will plan on rechecking at AEX.

## 2016-02-08 NOTE — Telephone Encounter (Signed)
No annual exam scheduled at this time. (provider canceled) Last annual 03-12-15.  Judeth Cornfield, please call patient to assist with rescheduling annual.

## 2016-02-10 NOTE — Telephone Encounter (Signed)
AEX scheduled for 03/03/16.  Pt has new insurance.  OK with appointment being early.

## 2016-02-10 NOTE — Telephone Encounter (Signed)
04 recall entered per Shirlyn Goltz, [name] earlier note.

## 2016-03-03 ENCOUNTER — Ambulatory Visit (INDEPENDENT_AMBULATORY_CARE_PROVIDER_SITE_OTHER): Payer: BLUE CROSS/BLUE SHIELD | Admitting: Nurse Practitioner

## 2016-03-03 ENCOUNTER — Encounter: Payer: Self-pay | Admitting: Nurse Practitioner

## 2016-03-03 VITALS — BP 116/74 | HR 80 | Ht 66.5 in | Wt 172.0 lb

## 2016-03-03 DIAGNOSIS — Z0000 Encounter for general adult medical examination without abnormal findings: Secondary | ICD-10-CM | POA: Diagnosis not present

## 2016-03-03 DIAGNOSIS — Z975 Presence of (intrauterine) contraceptive device: Secondary | ICD-10-CM | POA: Diagnosis not present

## 2016-03-03 DIAGNOSIS — Z01419 Encounter for gynecological examination (general) (routine) without abnormal findings: Secondary | ICD-10-CM | POA: Diagnosis not present

## 2016-03-03 LAB — POCT URINALYSIS DIPSTICK
Bilirubin, UA: NEGATIVE
Blood, UA: NEGATIVE
Glucose, UA: NEGATIVE
KETONES UA: NEGATIVE
LEUKOCYTES UA: NEGATIVE
Nitrite, UA: NEGATIVE
PH UA: 6
PROTEIN UA: NEGATIVE
UROBILINOGEN UA: NEGATIVE

## 2016-03-03 MED ORDER — FLUCONAZOLE 150 MG PO TABS: 150.0000 mg | ORAL_TABLET | Freq: Once | ORAL | Status: DC

## 2016-03-03 NOTE — Patient Instructions (Signed)

## 2016-03-03 NOTE — Progress Notes (Signed)
Patient ID: Gabrielle Ibarra, female   DOB: [birth year], 50 y.o.   MRN: [id number] 50 y.o. [id number] Married  Caucasian Fe here for annual exam.  No further left breast pain and in fact resolved in 2 days after her Mammo and [place] in November.  None to rare spotting with Mirena IUD.  Scheduled for a repeat IUD next spring.  She does have a rash between her thighs that is itching and feels it is from rubbing with her thighs.  She does not have urinary incontinence and is not wearing a pad daily.  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  Seems to be enjoying the challenge.  No LMP recorded. Patient is not currently having periods (Reason: IUD).          Sexually active: Yes.    The current method of family planning is IUD. Mirena placed 04/03/12. Exercising: Yes.    Home exercise routine includes walking. Smoker:  no  Health Maintenance: Pap:  01/10/13, WNL, neg HR HPV MMG:  05/28/15, 3D, Bi-Rads 1: Negative; Left diagnostic with ultrasound 11/16/15, Bi-Rads 1: Negative, return to bilateral screening 05/2016 Colonoscopy:  09/2015, normal, repeat in 10 years TDaP:  UTD about 7 yrs ago HIV: pregnancy 1998 and 2002  Labs: HB: PCP  Urine:  negative   reports that she has never smoked. She has never used smokeless tobacco. She reports that she drinks alcohol. She reports that she does not use illicit drugs.  Past Medical History  Diagnosis Date  . Depression   . Back pain   . Hyperlipidemia   . Anxiety   . Hypertension 2013  . Abnormal Pap smear of cervix 2000    ASCUS    Past Surgical History  Procedure Laterality Date  . Cesarean section      x 1  . Adenoidectomy    . Intrauterine device insertion  04/03/12    Mirena    Current Outpatient Prescriptions  Medication Sig Dispense Refill  . ALPRAZolam (XANAX) 0.5 MG tablet Take 0.5 mg by mouth at bedtime as needed for anxiety.    Marland Kitchen FINACEA 15 % FOAM   2  . losartan (COZAAR) 50 MG tablet Take 50 mg by mouth daily.    Marland Kitchen  zolpidem (AMBIEN) 10 MG tablet TAKE 1 TABLET BY MOUTH EVERY NIGHT AT BEDTIME AS NEEDED FOR SLEEP 30 tablet 5  . doxycycline (VIBRAMYCIN) 100 MG capsule Reported on 03/03/2016  1  . fluconazole (DIFLUCAN) 150 MG tablet Take 1 tablet (150 mg total) by mouth once. Take one tablet.  Repeat in 48 hours if symptoms are not completely resolved. 2 tablet 0   No current facility-administered medications for this visit.    Family History  Problem Relation Age of Onset  . Breast cancer Mother 37  . Hyperlipidemia Mother   . Hypertension Mother   . Hyperlipidemia Father   . Hypertension Father     ROS:  Pertinent items are noted in HPI.  Otherwise, a comprehensive ROS was negative.  Exam:   BP 116/74 mmHg  Pulse 80  Ht 5' 6.5" (1.689 m)  Wt 172 lb (78.019 kg)  BMI 27.35 kg/m2 Height: 5' 6.5" (168.9 cm) Ht Readings from Last 3 Encounters:  03/03/16 5' 6.5" (1.689 m)  11/09/15 5' 7.5" (1.715 m)  04/12/15 5' 5.75" (1.67 m)    General appearance: alert, cooperative and appears stated age Head: Normocephalic, without obvious abnormality, atraumatic Neck: no adenopathy, supple, symmetrical, trachea midline and  thyroid normal to inspection and palpation Lungs: clear to auscultation bilaterally Breasts: normal appearance, no masses or tenderness, there is no areas of pain or mass in the left breast at the area of previous concern.   Heart: regular rate and rhythm Abdomen: soft, non-tender; no masses,  no organomegaly Extremities: extremities normal, atraumatic, no cyanosis or edema Skin: Skin color, texture, turgor normal. No rashes or lesions Lymph nodes: Cervical, supraclavicular, and axillary nodes normal. No abnormal inguinal nodes palpated Neurologic: Grossly normal   Pelvic: External genitalia:  no lesions but the upper thighs have a pink and scaly areas that are fungal.              Urethra:  normal appearing urethra with no masses, tenderness or lesions              Bartholin's and  Skene's: normal                 Vagina: normal appearing vagina with normal color and discharge, no lesions              Cervix: anteverted  IUD strings are visible              Pap taken: Yes.   Bimanual Exam:  Uterus:  normal size, contour, position, consistency, mobility, non-tender              Adnexa: no mass, fullness, tenderness               Rectovaginal: Confirms               Anus:  normal sphincter tone, no lesions  Chaperone present: yes  A:  Well Woman with normal exam  Mirena IUD for contraception 04/03/12 Loss of libido Family stressors History of anxiety and depression, insomnia Rosacea HTN  Fungal rash upper thighs   P:   Reviewed health and wellness pertinent to exam  Pap smear as above  Mammogram is due 05/2016  Will have her use Diflucan X 2 doses and then some OTC antifungal cream or powder to help with the rash and friction  Counseled on breast self exam, mammography screening, adequate intake of calcium and vitamin D, diet and exercise return annually or prn  An After Visit Summary was printed and given to the patient.

## 2016-03-05 NOTE — Progress Notes (Signed)
Encounter reviewed by Dr. Brook Amundson C. Silva.  

## 2016-03-07 LAB — IPS PAP TEST WITH HPV

## 2016-03-17 ENCOUNTER — Ambulatory Visit: Payer: BLUE CROSS/BLUE SHIELD | Admitting: Nurse Practitioner

## 2016-03-24 ENCOUNTER — Telehealth: Payer: Self-pay | Admitting: Nurse Practitioner

## 2016-03-24 NOTE — Telephone Encounter (Signed)
Call to patient. She used oral diflucan as directed. She states that she continues to have area of irritated skin in inner thigh. Feels this is related to shaving area, but she is unsure. She sits a lot during the day. No streaking redness or warmth to skin. Improved after diflucan but not 100 percent better per patient.   Recommended  Monistat 3 (OTC) or OTC Gynelotrimin and Hydrocortisone ointment for external irritation. Advised to keep area clean and dry. Change Damp clothes as soon as possible. White cotton underwear is best. Can also try Aveeno Oatmeal Sitz Baths for relief of external irritation.   Reviewed note from last visit with Gabrielle CommentPatricia Grubb, [name] and advised of message regarding OTC treatment.  She is advised if symptoms persist then office visit with provider is recommended. Patient agreeable.  Routing to provider for final review. Patient agreeable to disposition. Will close encounter.

## 2016-03-24 NOTE — Telephone Encounter (Signed)
Patient was seen for AEX and was prescribed diflucan patient seems to still have symptoms and is wanting another refill of diflucan to be sent in. Best # to reach: [phone] [facility] [address]

## 2016-04-17 ENCOUNTER — Telehealth: Payer: Self-pay | Admitting: Emergency Medicine

## 2016-04-17 NOTE — Telephone Encounter (Signed)
Out of hold per Dr. Silva   

## 2016-04-17 NOTE — Telephone Encounter (Signed)
-----   Message from Patton SallesBrook E Amundson C Silva, MD sent at 04/14/2016  1:47 PM EDT ----- Regarding: RE: Mammogram hold  Ok to remove from mammogram hold.   Brook  ----- Message -----    From: Joeseph Amorracy L Analysa Nutting, RN    Sent: 04/14/2016  10:54 AM      To: Brook Rosalin HawkingE Amundson C Silva, MD Subject: Mammogram hold                                 Dr. Edward JollySilva,  [name], [name] patient who was in mammogram hold until follow up breast check. Completed 03/03/16 with annual exam. Okay to remove from hold?

## 2016-05-07 ENCOUNTER — Other Ambulatory Visit: Payer: Self-pay | Admitting: Nurse Practitioner

## 2016-05-08 NOTE — Telephone Encounter (Signed)
Medication refill request: Ambien  Last AEX:  03-03-16 Next AEX: 03-09-17 Last MMG (if hormonal medication request): 05-31-15 Refill authorized: please advise

## 2016-06-12 ENCOUNTER — Other Ambulatory Visit: Payer: Self-pay | Admitting: *Deleted

## 2016-06-12 MED ORDER — ZOLPIDEM TARTRATE 10 MG PO TABS: 10.0000 mg | ORAL_TABLET | Freq: Every evening | ORAL | Status: DC | PRN

## 2016-06-12 NOTE — Telephone Encounter (Signed)
Medication refill request: Ambien   Last AEX:  03-03-16  Next AEX: 03-09-17 Last MMG (if hormonal medication request): 11-16-15 U/S left breast- Return to screening mammography is recommended. The patient will be due for bilateral screening mammogram in June of 2017. Refill authorized: please advise

## 2016-09-28 ENCOUNTER — Other Ambulatory Visit: Payer: Self-pay | Admitting: Nurse Practitioner

## 2016-09-28 DIAGNOSIS — Z1231 Encounter for screening mammogram for malignant neoplasm of breast: Secondary | ICD-10-CM

## 2016-10-04 ENCOUNTER — Ambulatory Visit: Payer: BLUE CROSS/BLUE SHIELD

## 2016-10-20 ENCOUNTER — Ambulatory Visit: Payer: BLUE CROSS/BLUE SHIELD

## 2016-11-21 ENCOUNTER — Ambulatory Visit
Admission: RE | Admit: 2016-11-21 | Discharge: 2016-11-21 | Disposition: A | Discharge status: Home / Self Care | Payer: 59 | Source: Ambulatory Visit | Attending: Nurse Practitioner | Admitting: Nurse Practitioner

## 2016-11-21 ENCOUNTER — Ambulatory Visit: Payer: BLUE CROSS/BLUE SHIELD

## 2016-11-21 DIAGNOSIS — Z1231 Encounter for screening mammogram for malignant neoplasm of breast: Secondary | ICD-10-CM

## 2016-12-04 ENCOUNTER — Other Ambulatory Visit: Payer: Self-pay

## 2016-12-04 MED ORDER — ZOLPIDEM TARTRATE 10 MG PO TABS: 10.0000 mg | ORAL_TABLET | Freq: Every evening | ORAL | 4 refills | Status: DC | PRN

## 2016-12-04 NOTE — Telephone Encounter (Signed)
Faxed prescription to [facility]. [place]

## 2016-12-04 NOTE — Telephone Encounter (Signed)
Medication refill request: Zolpidem Last AEX:  03/03/16 PG Next AEX: 03/09/17 PG Last MMG (if hormonal medication request): 11/22/16 BIRADS1, [facility] Refill authorized: 06/12/16 #30 4R. Please advise. Thank you.

## 2017-01-02 ENCOUNTER — Telehealth: Payer: Self-pay | Admitting: Nurse Practitioner

## 2017-01-02 NOTE — Telephone Encounter (Signed)
Spoke with patient. Patient states she has been under an unusually sever amount of stress recently and is not sure if symptoms are all related to anxiety or possibly the start of menopause. Patient states she has also been seeing pcp Dr. Timothy Lassousso. Patient states she is not currently on any medications for anxiety and has IUD in place. Patient reports irritability, sweaty hands and feet, and hot flashes. Patient is asking if these are signs and symptoms of menopause? Reviewed signs and symptoms of menopause with patient. Patient not due for AEX until March 2018, what can I do in the meantime? Can they do blood test for menopause? Recommended OV with [name], NP  for further discussion and evaluation, patient declined at this time. Patient states she would like to think it over and would also like [name], NP opinion. Advised patient will review with [name], NP and return call with recommendations, patient is agreeable.  [name], NP -please advise?

## 2017-01-02 NOTE — Telephone Encounter (Signed)
Patient wants to speak with the nurse. She has some questions about menopause.

## 2017-01-03 ENCOUNTER — Other Ambulatory Visit: Payer: Self-pay | Admitting: Nurse Practitioner

## 2017-01-03 DIAGNOSIS — Z0000 Encounter for general adult medical examination without abnormal findings: Secondary | ICD-10-CM

## 2017-01-03 DIAGNOSIS — N951 Menopausal and female climacteric states: Secondary | ICD-10-CM

## 2017-01-03 NOTE — Telephone Encounter (Signed)
Called patient to discuss her vaso symptoms.  They were really bad for 2-3 days.  She feels this was related to extra stress with new job and Holidays.  Since she has called they are some better.  The feet and hands being so hot were a little more intense than usual.  Her IUD is scheduled to come out in March or April.  She does not want to go back to having cycles and deal with birth control issues.  This is her second IUD and would prefer another if needed.  We discussed doing a FSH closer to when her IUD would be removed and could make that decision then.  She would like to go ahead and have all fasting labs done a week prior to AEX and already have done at the exam date. She will CB and schedule lab apt.

## 2017-01-04 NOTE — Telephone Encounter (Signed)
Gabrielle Grubb, NP ok to close encounter?  

## 2017-03-09 ENCOUNTER — Ambulatory Visit: Payer: BLUE CROSS/BLUE SHIELD | Admitting: Nurse Practitioner

## 2017-03-12 ENCOUNTER — Other Ambulatory Visit: Payer: BLUE CROSS/BLUE SHIELD

## 2017-03-12 DIAGNOSIS — Z0000 Encounter for general adult medical examination without abnormal findings: Secondary | ICD-10-CM

## 2017-03-12 DIAGNOSIS — N951 Menopausal and female climacteric states: Secondary | ICD-10-CM

## 2017-03-12 LAB — LIPID PANEL
Cholesterol: 234 mg/dL — ABNORMAL HIGH (ref <200)
HDL: 63 mg/dL (ref >50)
LDL CALC: 145 mg/dL — AB (ref <100)
TRIGLYCERIDES: 132 mg/dL (ref <150)
Total CHOL/HDL Ratio: 3.7 Ratio (ref <5.0)
VLDL: 26 mg/dL (ref <30)

## 2017-03-12 LAB — COMPREHENSIVE METABOLIC PANEL
ALK PHOS: 78 U/L (ref 33–130)
ALT: 17 U/L (ref 6–29)
AST: 18 U/L (ref 10–35)
Albumin: 4.6 g/dL (ref 3.6–5.1)
BILIRUBIN TOTAL: 0.4 mg/dL (ref 0.2–1.2)
BUN: 12 mg/dL (ref 7–25)
CO2: 26 mmol/L (ref 20–31)
CREATININE: 0.83 mg/dL (ref 0.50–1.05)
Calcium: 10 mg/dL (ref 8.6–10.4)
Chloride: 105 mmol/L (ref 98–110)
Glucose, Bld: 109 mg/dL — ABNORMAL HIGH (ref 65–99)
Potassium: 4.9 mmol/L (ref 3.5–5.3)
SODIUM: 141 mmol/L (ref 135–146)
TOTAL PROTEIN: 7 g/dL (ref 6.1–8.1)

## 2017-03-12 LAB — CBC WITH DIFFERENTIAL/PLATELET
Basophils Absolute: 46 cells/uL (ref 0–200)
Basophils Relative: 1 %
EOS PCT: 2 %
Eosinophils Absolute: 92 cells/uL (ref 15–500)
HEMATOCRIT: 43.9 % (ref 35.0–45.0)
Hemoglobin: 14.5 g/dL (ref 11.7–15.5)
LYMPHS PCT: 43 %
Lymphs Abs: 1978 cells/uL (ref 850–3900)
MCH: 30.3 pg (ref 27.0–33.0)
MCHC: 33 g/dL (ref 32.0–36.0)
MCV: 91.6 fL (ref 80.0–100.0)
MPV: 9.4 fL (ref 7.5–12.5)
Monocytes Absolute: 460 cells/uL (ref 200–950)
Monocytes Relative: 10 %
NEUTROS PCT: 44 %
Neutro Abs: 2024 cells/uL (ref 1500–7800)
Platelets: 270 10*3/uL (ref 140–400)
RBC: 4.79 MIL/uL (ref 3.80–5.10)
RDW: 13.1 % (ref 11.0–15.0)
WBC: 4.6 10*3/uL (ref 3.8–10.8)

## 2017-03-12 LAB — TSH: TSH: 1.92 mIU/L

## 2017-03-13 LAB — FSH/LH
FSH: 82.9 m[IU]/mL
LH: 33.9 m[IU]/mL

## 2017-03-13 LAB — HEMOGLOBIN A1C
Hgb A1c MFr Bld: 5.4 % (ref <5.7)
Mean Plasma Glucose: 108 mg/dL

## 2017-03-13 LAB — VITAMIN D 25 HYDROXY (VIT D DEFICIENCY, FRACTURES): VIT D 25 HYDROXY: 31 ng/mL (ref 30–100)

## 2017-03-16 LAB — ANTI MULLERIAN HORMONE: AMH AssessR: <0.03 ng/mL

## 2017-03-20 ENCOUNTER — Encounter: Payer: Self-pay | Admitting: Nurse Practitioner

## 2017-03-20 ENCOUNTER — Ambulatory Visit (INDEPENDENT_AMBULATORY_CARE_PROVIDER_SITE_OTHER): Payer: BLUE CROSS/BLUE SHIELD | Admitting: Nurse Practitioner

## 2017-03-20 VITALS — BP 136/94 | HR 68 | Ht 65.75 in | Wt 165.0 lb

## 2017-03-20 DIAGNOSIS — F439 Reaction to severe stress, unspecified: Secondary | ICD-10-CM

## 2017-03-20 DIAGNOSIS — Z01411 Encounter for gynecological examination (general) (routine) with abnormal findings: Secondary | ICD-10-CM | POA: Diagnosis not present

## 2017-03-20 DIAGNOSIS — I10 Essential (primary) hypertension: Secondary | ICD-10-CM

## 2017-03-20 DIAGNOSIS — Z975 Presence of (intrauterine) contraceptive device: Secondary | ICD-10-CM

## 2017-03-20 DIAGNOSIS — Z0000 Encounter for general adult medical examination without abnormal findings: Secondary | ICD-10-CM

## 2017-03-20 NOTE — Patient Instructions (Signed)

## 2017-03-20 NOTE — Progress Notes (Signed)
Patient ID: Gabrielle BolognaElizabeth Cabana, female   DOB: April 08, 1966, 51 y.o.   MRN: [id number]  51 y.o. [id number] Married  Caucasian Fe here for annual exam.  No new health problems this past year.  She lost her job last fall and has been seeking employment.  She was sure she got a job this am only to be called and rejected.  She is emotionally upset today.  Her IUD is scheduled to come out next month.  She was very concerned about returning to having menses and if possible pregnancy.  She had labs done prior to visit so we cold discuss them today.  The AMH, FSH, LH all show that she is menopausal.  She continues to have a low libido but she thinks now this is worse because of family and financial stressors.  Patient's last menstrual period was 10/31/2006.          Sexually active: Yes.    The current method of family planning is IUD.  Mirena placed 04/03/2012. Exercising: Yes.   Smoker:  no  Health Maintenance: Pap: 03/03/16, Negative with neg HR HPV  01/10/13, Negative with neg HR HPV MMG: 11/21/16, 3D, Bi-Rads 1:  Negative Colonoscopy:  09/2015, normal, repeat in 10 years TDaP:  UTD about 7 yrs ago HIV: pregnancy 1998 and 2002  Labs: 03/12/17 in EPIC   reports that she has never smoked. She has never used smokeless tobacco. She reports that she drinks alcohol. She reports that she does not use drugs.  Past Medical History:  Diagnosis Date  . Abnormal Pap smear of cervix 2000   ASCUS  . Anxiety   . Back pain   . Depression   . Hyperlipidemia   . Hypertension 2013    Past Surgical History:  Procedure Laterality Date  . ADENOIDECTOMY    . CESAREAN SECTION     x 1  . INTRAUTERINE DEVICE INSERTION  04/03/12   Mirena    Current Outpatient Prescriptions  Medication Sig Dispense Refill  . ALPRAZolam (XANAX) 0.5 MG tablet Take 0.5 mg by mouth at bedtime as needed for anxiety.    Marland Kitchen. doxycycline (VIBRAMYCIN) 100 MG capsule Reported on 03/03/2016  1  . FINACEA 15 % FOAM   2  . losartan (COZAAR) 50 MG  tablet Take 50 mg by mouth daily.    Marland Kitchen. zolpidem (AMBIEN) 10 MG tablet Take 1 tablet (10 mg total) by mouth at bedtime as needed. for sleep prn 30 tablet 4   No current facility-administered medications for this visit.     Family History  Problem Relation Age of Onset  . Breast cancer Mother 2255  . Hyperlipidemia Mother   . Hypertension Mother   . Hyperlipidemia Father   . Hypertension Father     ROS:  Pertinent items are noted in HPI.  Otherwise, a comprehensive ROS was negative.  Exam:   BP (!) 136/94 (BP Location: Right Arm, Patient Position: Sitting, Cuff Size: Normal)   Pulse 68   Ht 5' 5.75" (1.67 m)   Wt 165 lb (74.8 kg)   LMP 10/31/2006   BMI 26.83 kg/m  Height: 5' 5.75" (167 cm) Ht Readings from Last 3 Encounters:  03/20/17 5' 5.75" (1.67 m)  03/03/16 5' 6.5" (1.689 m)  11/09/15 5' 7.5" (1.715 m)    General appearance: alert, cooperative and appears stated age Head: Normocephalic, without obvious abnormality, atraumatic Neck: no adenopathy, supple, symmetrical, trachea midline and thyroid normal to inspection and palpation Lungs: clear to auscultation bilaterally Breasts:  normal appearance, no masses or tenderness Heart: regular rate and rhythm Abdomen: soft, non-tender; no masses,  no organomegaly Extremities: extremities normal, atraumatic, no cyanosis or edema Skin: Skin color, texture, turgor normal. No rashes or lesions Lymph nodes: Cervical, supraclavicular, and axillary nodes normal. No abnormal inguinal nodes palpated Neurologic: Grossly normal   Pelvic: External genitalia:  no lesions              Urethra:  normal appearing urethra with no masses, tenderness or lesions              Bartholin's and Skene's: normal                 Vagina: normal appearing vagina with normal color and discharge, no lesions              Cervix: anteverted  IUD strings are visualized              Pap taken: No. Bimanual Exam:  Uterus:  normal size, contour, position,  consistency, mobility, non-tender              Adnexa: no mass, fullness, tenderness               Rectovaginal: Confirms               Anus:  normal sphincter tone, no lesions  Chaperone present: yes  A:  Well Woman with normal exam  Mirena IUD for contraception 04/03/12 Loss of libido Family stressors and trying to find a job History of anxiety and depression, insomnia Rosacea HTN - BP elevated today due to stress               P:   Reviewed health and wellness pertinent to exam  Pap smear not done  Mammogram is due 10/2017  I had already spoke with Dr. Hyacinth Meeker today about removal of IUD while here - pt was just too upset to have anything else done - so she plans to call back and schedule later.  Recent labs are reviewed with patient and she will have PCP to address the lipid panel.  She also plans on discussing treatment options for depression.  They have worked on several med's in the past few yrs.  Counseled on breast self exam, mammography screening, adequate intake of calcium and vitamin D, diet and exercise, Kegel's exercises return annually or prn  An After Visit Summary was printed and given to the patient.

## 2017-03-24 NOTE — Progress Notes (Signed)
Reviewed personally.  M. Suzanne Sirenity Shew, MD.  

## 2017-05-04 ENCOUNTER — Other Ambulatory Visit: Payer: Self-pay | Admitting: Nurse Practitioner

## 2017-05-04 NOTE — Telephone Encounter (Signed)
Medication refill request: Zolpidem 10mg  Last AEX:  03/20/17 PG Next AEX: 03/22/18 Last MMG (if hormonal medication request): 11/21/16 BIRADS 1 negative Refill authorized: 12/04/16 #30 w/4 refills; today please advise

## 2017-05-29 ENCOUNTER — Encounter: Payer: Self-pay | Admitting: Nurse Practitioner

## 2017-05-29 ENCOUNTER — Telehealth: Payer: Self-pay | Admitting: Nurse Practitioner

## 2017-05-29 ENCOUNTER — Ambulatory Visit (INDEPENDENT_AMBULATORY_CARE_PROVIDER_SITE_OTHER): Payer: BLUE CROSS/BLUE SHIELD | Admitting: Nurse Practitioner

## 2017-05-29 VITALS — BP 122/80 | HR 64 | Resp 16 | Ht 65.75 in | Wt 163.2 lb

## 2017-05-29 DIAGNOSIS — Z975 Presence of (intrauterine) contraceptive device: Secondary | ICD-10-CM

## 2017-05-29 DIAGNOSIS — R1030 Lower abdominal pain, unspecified: Secondary | ICD-10-CM

## 2017-05-29 LAB — POCT URINALYSIS DIPSTICK
BILIRUBIN UA: NEGATIVE
GLUCOSE UA: NEGATIVE
Ketones, UA: NEGATIVE
NITRITE UA: NEGATIVE
Protein, UA: NEGATIVE
RBC UA: NEGATIVE
Urobilinogen, UA: 0.2 E.U./dL
pH, UA: 5 (ref 5.0–8.0)

## 2017-05-29 MED ORDER — PHENAZOPYRIDINE HCL 200 MG PO TABS: 200.0000 mg | ORAL_TABLET | Freq: Three times a day (TID) | ORAL | 0 refills | Status: DC | PRN

## 2017-05-29 MED ORDER — NITROFURANTOIN MONOHYD MACRO 100 MG PO CAPS: 100.0000 mg | ORAL_CAPSULE | Freq: Two times a day (BID) | ORAL | 0 refills | Status: DC

## 2017-05-29 NOTE — Progress Notes (Signed)
51 y.o.Married Caucasian female [phone] here with complaint of UTI, with onset  on Sunday. Patient complaining of:  urinary urgency, abdominal pain and urethral irritation.. Patient denies fever, chills, nausea or back pain. No new personal products. Patient feels not related to sexual activity. Denies vaginal symptoms.    Contraception is IUD.  She is past due to IUD removal and wishes to return on 06/11/17 to have Dr. Hyacinth MeekerMiller remove this.   No change in partner.  Menopausal with vaginal dryness. Patient does not have adequate water intake.  She is leaving today for a trip to the beach to help a friend redo / clean a condo.   Past Medical History:  Diagnosis Date  . Abnormal Pap smear of cervix 2000   ASCUS  . Anxiety   . Back pain   . Depression   . Hyperlipidemia   . Hypertension 2013    Past Surgical History:  Procedure Laterality Date  . ADENOIDECTOMY    . CESAREAN SECTION     x 1  . INTRAUTERINE DEVICE INSERTION  04/03/12   Mirena    Current Outpatient Prescriptions  Medication Sig Dispense Refill  . ALPRAZolam (XANAX) 0.5 MG tablet Take 0.5 mg by mouth at bedtime as needed for anxiety.    Marland Kitchen. doxycycline (VIBRAMYCIN) 100 MG capsule Reported on 03/03/2016  1  . losartan (COZAAR) 50 MG tablet Take 50 mg by mouth daily.    Marland Kitchen. zolpidem (AMBIEN) 10 MG tablet TAKE 1 TABLET BY MOUTH AT BEDTIME AS NEEDED FOR SLEEP 30 tablet 3   No current facility-administered medications for this visit.     ALLERGIES: Sulfa antibiotics and Zoloft [sertraline hcl]  PHYSICAL EXAMINATION:  BP 122/80 (BP Location: Right Arm, Patient Position: Sitting, Cuff Size: Normal)   Pulse 64   Resp 16   Ht 5' 5.75" (1.67 m)   Wt 163 lb 3.2 oz (74 kg)   BMI 26.54 kg/m  Healthy female WDWN Affect: Normal, orientation x 3 Skin : warm and dry CVAT: negative bilateral Abdomen: positive for suprapubic tenderness  Pelvic exam: External genital area: normal, no lesions Bladder,Urethra tender, Urethral meatus:  normal Vagina: normal vaginal discharge, normal appearance Cervix: normal, non tender, IUD strings are visible Uterus:normal,non tender Adnexa: normal non tender, no fullness or masses  POCT:  Large 3+ WBC  A:  R/O UTI  Normal pelvic exam with IUD strings visible  P:  Reviewed findings of UTI and need for treatment.  Rx:  Macrobid 100 mg BID  Rx:  Pyridium 200 mg TID prn  Order is placed for IUD removal to be done later. JWJ:XBJYNLab:Urine culture Reviewed warning signs and symptoms of UTI and need to advise if occurring. Encouraged to limit soda, tea, and coffee   RV prn

## 2017-05-29 NOTE — Telephone Encounter (Signed)
This patient was today with Shirlyn GoltzPatty Grubb. Patient needs to return for IUD removal with Dr.Miller. Patient is interested in 06/11/17.

## 2017-05-29 NOTE — Progress Notes (Signed)
Reviewed personally.  M. Suzanne Taydon Nasworthy, MD.  

## 2017-05-29 NOTE — Telephone Encounter (Signed)
Call placed to patient to review benefits and schedule an IUD removal. Left voicemail requesting a return call.

## 2017-05-29 NOTE — Patient Instructions (Signed)

## 2017-05-30 LAB — URINE CULTURE: ORGANISM ID, BACTERIA: NO GROWTH

## 2017-06-04 NOTE — Telephone Encounter (Signed)
Patient is returning a call to [place]. Patient is aware Ander SladeJoy is not here today.

## 2017-06-05 NOTE — Telephone Encounter (Signed)
Patient notified of results as written by provider 

## 2017-06-05 NOTE — Telephone Encounter (Signed)
[facility]. Spoke with patient in regards to benefits for an IUD removal. Patient is agreeable to information presented. Patient also advises her scheduled is "very busy" and she will call back when she is ready to proceed with scheduling.  Routing to [name], [name]

## 2017-06-07 ENCOUNTER — Telehealth: Payer: Self-pay | Admitting: Nurse Practitioner

## 2017-06-07 NOTE — Telephone Encounter (Signed)
Spoke with patient. Patient reports completing Macrobid for UTI on 06/06/17. Patient states she is still experiencing a "higher up urethral pain" and feels she needs additional antibiotics to completely knock out infection. Reports has increased water intake, voiding more often, large amounts. Denies any other urinary symptoms, lower back pain, nausea or fever. Patient states she can not come in for OV, has funeral this afternoon and does not wish to delay treatment. Advised patient [name], NP is out of the office today, will review with covering provider and return call with recommendations, patient is agreeable.   Leota Sauerseborah Leonard, CNM -please advise?  Cc: [name], NP

## 2017-06-07 NOTE — Telephone Encounter (Signed)
Patient called and said, "I was recently seen for a UTI and the symptoms are now back all the way. I'd like another round of antibiotics." Patient declined an appointment.  [facility] [address]  Last seen: 05/29/17

## 2017-06-07 NOTE — Telephone Encounter (Signed)
Left message to call Sylvie Mifsud at [phone].  

## 2017-06-08 NOTE — Telephone Encounter (Signed)
Patient checking the status of script

## 2017-06-08 NOTE — Telephone Encounter (Signed)
Spoke with patient, advised as seen below per [name], NP. Patient states she does not feel pyridium works for urinary pain relief, prefers motrin. Patient reports no new symptoms -denies urgency, burning with urination, fever, lower back pain. Patient states she is drinking more water resulting in increased frequency. Patient states she feels like antibiotic would be appropriate to prevent additional symptoms from reoccurring. Again recommended OV for further evaluation and repeat urine culture, patient declined OV at this time, will continue to monitor. Patient verbalizes understanding.  Routing to provider for final review. Patient is agreeable to disposition. Will close encounter.

## 2017-06-08 NOTE — Telephone Encounter (Signed)
Offer Pyridium 200 mg TID prn until she can come in for OV.  If worse over the weekend go to Urgent Care.

## 2017-06-20 NOTE — Telephone Encounter (Signed)
OK to close

## 2017-06-20 NOTE — Telephone Encounter (Signed)
Okay to close encounter? It looks like the patient is to call us to schedule when she is ready.

## 2017-07-02 ENCOUNTER — Telehealth: Payer: Self-pay | Admitting: Obstetrics & Gynecology

## 2017-07-02 NOTE — Telephone Encounter (Signed)
Patient says she is calling back to get insurance information for iud removal.

## 2017-07-04 NOTE — Telephone Encounter (Signed)
Returned call to patient to review benefits information, previously discussed on 06/05/17. Left voicemail requesting a return call.    cc: Dr Hyacinth MeekerMiller

## 2017-07-30 NOTE — Telephone Encounter (Signed)
Spoke with patient and scheduled IUD removal 08-24-17 4:00pm with Dr. Hyacinth MeekerMiller.

## 2017-07-30 NOTE — Telephone Encounter (Signed)
Patient is ready to schedule IUD removal.

## 2017-08-23 ENCOUNTER — Other Ambulatory Visit: Payer: Self-pay | Admitting: *Deleted

## 2017-08-23 ENCOUNTER — Telehealth: Payer: Self-pay | Admitting: Obstetrics & Gynecology

## 2017-08-23 DIAGNOSIS — Z30432 Encounter for removal of intrauterine contraceptive device: Secondary | ICD-10-CM

## 2017-08-23 NOTE — Telephone Encounter (Signed)
Patient cancelled her iud removal appointment and would like nurse to call and get her rescheduled.

## 2017-08-23 NOTE — Telephone Encounter (Signed)
Spoke with patient. Patient needs to reschedule IUD removal. Mirena was placed 04/03/2012. On 03/12/17 AMH, FSH, LH showed that she is menopausal. IUD removal rescheduled for 09/17/2017 at 3:30 pm with Dr.Miller. Patient is agreeable to date and time.  Routing to provider for final review. Patient agreeable to disposition. Will close encounter.

## 2017-08-24 ENCOUNTER — Ambulatory Visit: Payer: BLUE CROSS/BLUE SHIELD | Admitting: Obstetrics & Gynecology

## 2017-09-17 ENCOUNTER — Encounter: Payer: Self-pay | Admitting: Obstetrics & Gynecology

## 2017-09-17 ENCOUNTER — Ambulatory Visit (INDEPENDENT_AMBULATORY_CARE_PROVIDER_SITE_OTHER): Payer: BLUE CROSS/BLUE SHIELD | Admitting: Obstetrics & Gynecology

## 2017-09-17 DIAGNOSIS — Z30432 Encounter for removal of intrauterine contraceptive device: Secondary | ICD-10-CM | POA: Diagnosis not present

## 2017-09-17 NOTE — Progress Notes (Signed)
51 y.o. G63P3003 Married Caucasian female presents for removal of Mirena IUD.  Removal is overdue.  She had hormonal testing earlier this spring that showed she was fully menopsausal.  Has been using the IUD for contraception as well.    Pt aware she may have some spotting today and if she does, this is fine.  However, if she has future bleeding, she is aware she needs to call.  All questions answered.   LMP:  No LMP recorded. Patient is not currently having periods (Reason: IUD).  Patient Active Problem List   Diagnosis Date Noted  . Decreased libido 02/13/2014  . Situational stress 02/13/2014  . Pain in joint, upper arm 02/13/2014   Past Medical History:  Diagnosis Date  . Abnormal Pap smear of cervix 2000   ASCUS  . Anxiety   . Back pain   . Depression   . Hyperlipidemia   . Hypertension 2013   Current Outpatient Prescriptions on File Prior to Visit  Medication Sig Dispense Refill  . ALPRAZolam (XANAX) 0.5 MG tablet Take 0.5 mg by mouth at bedtime as needed for anxiety.    Marland Kitchen losartan (COZAAR) 50 MG tablet Take 50 mg by mouth daily.    Marland Kitchen zolpidem (AMBIEN) 10 MG tablet TAKE 1 TABLET BY MOUTH AT BEDTIME AS NEEDED FOR SLEEP 30 tablet 3   No current facility-administered medications on file prior to visit.    Sulfa antibiotics and Zoloft [sertraline hcl]  Review of Systems  All other systems reviewed and are negative.  Vitals:   09/17/17 1525  BP: (!) 146/82  Pulse: 100  Resp: 16  Weight: 157 lb 8 oz (71.4 kg)    Gen:  [facility] healthy female NAD Abdomen: soft, non-tender Groin:  no inguinal nodes palpated  Pelvic exam: Vulva:  normal female genitalia Vagina:  normal vagina Cervix:  Non-tender, Negative CMT, no lesions or redness. Uterus:  normal shape, position and consistency   Procedure:  Speculum reinserted.  Cervix visualized.  IUD string noted and grasped with ringed forcep.  With one pull, IUD removed easily.  Pt has some mild cramping but tolerated this well.   Minimal spotting noted.  BME after removal showed non-tender uterus.  A: Removal of Mirena IUD  Menopausal  P:  Return for AEX which is already scheduled.

## 2017-10-18 ENCOUNTER — Other Ambulatory Visit: Payer: Self-pay

## 2017-10-18 NOTE — Telephone Encounter (Signed)
Medication refill request: Ambien Last AEX:  03/20/17 PG Next AEX: 03/21/18 Last MMG (if hormonal medication request): 11/21/16 BIRADS 1 negative/density b Refill authorized: 05/04/17 #30 w/3 refills; today please advise

## 2017-10-19 NOTE — Telephone Encounter (Signed)
According Patty's note she was working with PCP regarding these medications. I know Patty refilled, but should refill now with PCP.

## 2017-10-19 NOTE — Telephone Encounter (Signed)
The following message was reviewed in detail with patient. Will close encounter.

## 2018-03-21 ENCOUNTER — Ambulatory Visit: Payer: BLUE CROSS/BLUE SHIELD | Admitting: Certified Nurse Midwife

## 2018-03-22 ENCOUNTER — Ambulatory Visit: Payer: BLUE CROSS/BLUE SHIELD | Admitting: Nurse Practitioner

## 2018-05-10 ENCOUNTER — Ambulatory Visit: Payer: BLUE CROSS/BLUE SHIELD | Admitting: Certified Nurse Midwife

## 2018-06-19 ENCOUNTER — Telehealth: Payer: Self-pay | Admitting: Certified Nurse Midwife

## 2018-06-19 ENCOUNTER — Ambulatory Visit: Payer: BLUE CROSS/BLUE SHIELD | Admitting: Certified Nurse Midwife

## 2018-06-19 NOTE — Telephone Encounter (Signed)
Patient canceled her aex today due to losing her job yesterday afternoon. Patient said "I need time to process this and will call later to reschedule".

## 2019-06-09 ENCOUNTER — Ambulatory Visit: Payer: BLUE CROSS/BLUE SHIELD | Admitting: Obstetrics and Gynecology

## 2019-07-22 ENCOUNTER — Other Ambulatory Visit: Payer: Self-pay

## 2019-07-22 DIAGNOSIS — Z20822 Contact with and (suspected) exposure to covid-19: Secondary | ICD-10-CM

## 2019-07-24 LAB — NOVEL CORONAVIRUS, NAA: SARS-CoV-2, NAA: NOT DETECTED

## 2019-08-01 ENCOUNTER — Other Ambulatory Visit: Payer: Self-pay

## 2019-08-01 DIAGNOSIS — Z20822 Contact with and (suspected) exposure to covid-19: Secondary | ICD-10-CM

## 2019-08-02 LAB — NOVEL CORONAVIRUS, NAA: SARS-CoV-2, NAA: NOT DETECTED

## 2019-08-26 ENCOUNTER — Other Ambulatory Visit: Payer: Self-pay

## 2019-08-28 ENCOUNTER — Encounter: Payer: Self-pay | Admitting: Obstetrics and Gynecology

## 2019-08-28 ENCOUNTER — Ambulatory Visit: Payer: BC Managed Care – PPO | Admitting: Obstetrics and Gynecology

## 2019-08-28 ENCOUNTER — Other Ambulatory Visit: Payer: Self-pay

## 2019-08-28 VITALS — BP 112/76 | HR 88 | Temp 97.4°F | Ht 65.5 in | Wt 165.6 lb

## 2019-08-28 DIAGNOSIS — N941 Unspecified dyspareunia: Secondary | ICD-10-CM

## 2019-08-28 DIAGNOSIS — N952 Postmenopausal atrophic vaginitis: Secondary | ICD-10-CM

## 2019-08-28 DIAGNOSIS — Z01419 Encounter for gynecological examination (general) (routine) without abnormal findings: Secondary | ICD-10-CM | POA: Diagnosis not present

## 2019-08-28 NOTE — Progress Notes (Signed)
53 y.o. 603P3003 Married White or Caucasian Not Hispanic or Latino female here for annual exam.   No c/o. No vaginal bleeding. She is occasionally sexually active. Slight entry dyspareunia, some help with lubricant.   Recently worsening lipids, primary wants her to have a cardiac scan. She has been exercising, eating healthy, 10 lbs weight loss since January.     Patient's last menstrual period was 10/31/2006.          Sexually active: Yes.    The current method of family planning is post menopausal status.    Exercising: Yes.    walking Smoker:  no  Health Maintenance: Pap:  03/03/3016 WNL NEG HPV, 01/10/13 WNL neg HR HPV Abnormal pap: Yes, in 20s repeat was normal MMG:  11/21/2016 Birads 1 negative Colonoscopy:  09/2015, normal, repeat in 10 years TDaP:  09/23/2013 Gardasil: N/A   reports that she has never smoked. She has never used smokeless tobacco. She reports current alcohol use. She reports that she does not use drugs. 2 glasses of wine a night. 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  Kids 7224, 22, 8418 (2 oldest are girls, youngest is a boy).   Past Medical History:  Diagnosis Date  . Abnormal Pap smear of cervix 2000   ASCUS  . Anxiety   . Back pain   . Depression   . Hyperlipidemia   . Hypertension 2013    Past Surgical History:  Procedure Laterality Date  . ADENOIDECTOMY    . CESAREAN SECTION     x 1  . INTRAUTERINE DEVICE INSERTION  04/03/12   Mirena    Current Outpatient Medications  Medication Sig Dispense Refill  . ALPRAZolam (XANAX) 0.5 MG tablet Take 0.5 mg by mouth at bedtime as needed for anxiety.    Marland Kitchen. doxycycline (VIBRAMYCIN) 50 MG capsule   2  . fluticasone (FLONASE) 50 MCG/ACT nasal spray   1  . losartan (COZAAR) 50 MG tablet Take 50 mg by mouth daily.    . TRINTELLIX 10 MG TABS   0  . zolpidem (AMBIEN) 10 MG tablet TAKE 1 TABLET BY MOUTH AT BEDTIME AS NEEDED FOR SLEEP 30 tablet 3   No current facility-administered medications for this visit.      Family History  Problem Relation Age of Onset  . Breast cancer Mother 1755  . Hyperlipidemia Mother   . Hypertension Mother   . Hyperlipidemia Father   . Hypertension Father     Review of Systems  Constitutional: Negative.   HENT: Negative.   Eyes: Negative.   Respiratory: Negative.   Cardiovascular: Negative.   Gastrointestinal: Negative.   Endocrine: Negative.   Genitourinary: Negative.   Musculoskeletal: Negative.   Skin: Negative.   Allergic/Immunologic: Negative.   Neurological: Negative.   Hematological: Negative.   Psychiatric/Behavioral: Negative.     Exam:   BP 112/76 (BP Location: Right Arm, Patient Position: Sitting, Cuff Size: Normal)   Pulse 88   Temp (!) 97.4 F (36.3 C) (Skin)   Ht 5' 5.5" (1.664 m)   Wt 165 lb 9.6 oz (75.1 kg)   LMP 10/31/2006   BMI 27.14 kg/m   Weight change: @WEIGHTCHANGE @ Height:   Height: 5' 5.5" (166.4 cm)  Ht Readings from Last 3 Encounters:  08/28/19 5' 5.5" (1.664 m)  05/29/17 5' 5.75" (1.67 m)  03/20/17 5' 5.75" (1.67 m)    General appearance: alert, cooperative and appears stated age Head: Normocephalic, without obvious abnormality, atraumatic Neck: no adenopathy, supple, symmetrical,  trachea midline and thyroid normal to inspection and palpation Lungs: clear to auscultation bilaterally Cardiovascular: regular rate and rhythm Breasts: normal appearance, no masses or tenderness Abdomen: soft, non-tender; non distended,  no masses,  no organomegaly Extremities: extremities normal, atraumatic, no cyanosis or edema Skin: Skin color, texture, turgor normal. No rashes or lesions Lymph nodes: Cervical, supraclavicular, and axillary nodes normal. No abnormal inguinal nodes palpated Neurologic: Grossly normal   Pelvic: External genitalia:  no lesions              Urethra:  normal appearing urethra with no masses, tenderness or lesions              Bartholins and Skenes: normal                 Vagina: atrophic appearing  vagina with normal color and discharge, no lesions              Cervix: no lesions               Bimanual Exam:  Uterus:  normal size, contour, position, consistency, mobility, non-tender              Adnexa: no mass, fullness, tenderness               Rectovaginal: Confirms               Anus:  normal sphincter tone, no lesions  Chaperone was present for exam.  A:  Well Woman with normal exam  Mild dyspareunia, vaginal atrophy  P:   No pap this year  Mammogram due, she will schedule  Colonoscopy UTD  Discussed breast self exam  Discussed calcium and vit D intake  Labs with primary  Use lubricant, she should control rate and depth of penetration  Call if she wants vaginal estrogen

## 2019-08-28 NOTE — Patient Instructions (Signed)

## 2019-09-11 ENCOUNTER — Other Ambulatory Visit: Payer: Self-pay | Admitting: Internal Medicine

## 2019-09-11 DIAGNOSIS — E785 Hyperlipidemia, unspecified: Secondary | ICD-10-CM

## 2019-09-19 ENCOUNTER — Ambulatory Visit
Admission: RE | Admit: 2019-09-19 | Discharge: 2019-09-19 | Disposition: A | Discharge status: Home / Self Care | Payer: 59 | Source: Ambulatory Visit | Attending: Internal Medicine | Admitting: Internal Medicine

## 2019-09-19 DIAGNOSIS — E785 Hyperlipidemia, unspecified: Secondary | ICD-10-CM

## 2019-10-20 ENCOUNTER — Encounter (INDEPENDENT_AMBULATORY_CARE_PROVIDER_SITE_OTHER): Payer: Self-pay

## 2019-12-05 ENCOUNTER — Other Ambulatory Visit: Payer: Self-pay | Admitting: Internal Medicine

## 2019-12-05 DIAGNOSIS — Z1231 Encounter for screening mammogram for malignant neoplasm of breast: Secondary | ICD-10-CM

## 2020-01-29 ENCOUNTER — Ambulatory Visit: Payer: 59 | Attending: Internal Medicine

## 2020-01-29 DIAGNOSIS — Z20822 Contact with and (suspected) exposure to covid-19: Secondary | ICD-10-CM

## 2020-01-29 DIAGNOSIS — U071 COVID-19: Secondary | ICD-10-CM | POA: Insufficient documentation

## 2020-01-30 LAB — NOVEL CORONAVIRUS, NAA: SARS-CoV-2, NAA: DETECTED — AB

## 2020-03-15 ENCOUNTER — Encounter: Payer: Self-pay | Admitting: Certified Nurse Midwife

## 2020-07-29 ENCOUNTER — Other Ambulatory Visit: Payer: Self-pay | Admitting: Internal Medicine

## 2020-07-29 DIAGNOSIS — Z1231 Encounter for screening mammogram for malignant neoplasm of breast: Secondary | ICD-10-CM

## 2020-08-25 ENCOUNTER — Other Ambulatory Visit: Payer: Self-pay

## 2020-08-25 ENCOUNTER — Ambulatory Visit
Admission: RE | Admit: 2020-08-25 | Discharge: 2020-08-25 | Disposition: A | Discharge status: Home / Self Care | Payer: Self-pay | Source: Ambulatory Visit | Attending: Internal Medicine | Admitting: Internal Medicine

## 2020-08-25 DIAGNOSIS — Z1231 Encounter for screening mammogram for malignant neoplasm of breast: Secondary | ICD-10-CM

## 2020-09-29 ENCOUNTER — Other Ambulatory Visit: Payer: Self-pay

## 2020-09-29 ENCOUNTER — Ambulatory Visit: Payer: 59 | Admitting: Obstetrics and Gynecology

## 2020-09-29 ENCOUNTER — Encounter: Payer: Self-pay | Admitting: Obstetrics and Gynecology

## 2020-09-29 VITALS — BP 154/96 | HR 90 | Resp 14 | Ht 65.75 in | Wt 176.5 lb

## 2020-09-29 DIAGNOSIS — Z01419 Encounter for gynecological examination (general) (routine) without abnormal findings: Secondary | ICD-10-CM

## 2020-09-29 DIAGNOSIS — Z124 Encounter for screening for malignant neoplasm of cervix: Secondary | ICD-10-CM | POA: Diagnosis not present

## 2020-09-29 NOTE — Progress Notes (Addendum)
54 y.o. G68P3003 Married White or Caucasian Not Hispanic or Latino female here for annual exam.  No vaginal bleeding. No bowel or bladder c/o. Infrequently sexually active, some entry pain, some help with lubricant. Husband with ED.     She had a terrible vulvar, pelvic, perianal pain. Primary treated her for a UTI and she got better.   She had covid in 2/21, she still doesn't have her sense of smell.   Patient's last menstrual period was 10/31/2006.          Sexually active: Yes.    The current method of family planning is post menopausal status.    Exercising: Yes.    walking Smoker:  no  Health Maintenance: Pap:  03/03/3016 WNL NEG HPV, 01/10/13 WNL neg HR HPV History of abnormal Pap:  Yes, in her 20's the repeat was normal  MMG:  08/25/20 density B Bi-rads 1 neg  BMD:   None  Colonoscopy: 2016 repeat in 10 years  TDaP:  09/23/13  Gardasil: NA   reports that she has never smoked. She has never used smokeless tobacco. She reports current alcohol use. She reports that she does not use drugs. Has 1-2 glasses of wine at night. 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 Daughters are 29 and 1, son is 45.  Past Medical History:  Diagnosis Date  . Abnormal Pap smear of cervix 2000   ASCUS  . Anxiety   . Back pain   . Depression   . Hyperlipidemia   . Hypertension 2013    Past Surgical History:  Procedure Laterality Date  . ADENOIDECTOMY    . CESAREAN SECTION     x 1    Current Outpatient Medications  Medication Sig Dispense Refill  . ALPRAZolam (XANAX) 0.5 MG tablet Take 0.5 mg by mouth at bedtime as needed for anxiety.    Marland Kitchen doxycycline (VIBRAMYCIN) 50 MG capsule   2  . fluticasone (FLONASE) 50 MCG/ACT nasal spray   1  . losartan (COZAAR) 50 MG tablet Take 50 mg by mouth daily.    . Rosuvastatin Calcium (CRESTOR PO) Take by mouth.    . venlafaxine XR (EFFEXOR-XR) 37.5 MG 24 hr capsule Take 37.5 mg by mouth daily.    Marland Kitchen zolpidem (AMBIEN) 10 MG tablet TAKE 1 TABLET BY MOUTH AT  BEDTIME AS NEEDED FOR SLEEP 30 tablet 3   No current facility-administered medications for this visit.    Family History  Problem Relation Age of Onset  . Breast cancer Mother 16  . Hyperlipidemia Mother   . Hypertension Mother   . Hyperlipidemia Father   . Hypertension Father     Review of Systems  All other systems reviewed and are negative.   Exam:   BP (!) 154/96 (BP Location: Right Arm, Patient Position: Sitting, Cuff Size: Normal)   Pulse 90   Resp 14   Ht 5' 5.75" (1.67 m)   Wt 176 lb 8 oz (80.1 kg)   LMP 10/31/2006   BMI 28.70 kg/m   Weight change: @WEIGHTCHANGE @ Height:   Height: 5' 5.75" (167 cm)  Ht Readings from Last 3 Encounters:  09/29/20 5' 5.75" (1.67 m)  08/28/19 5' 5.5" (1.664 m)  05/29/17 5' 5.75" (1.67 m)    General appearance: alert, cooperative and appears stated age Head: Normocephalic, without obvious abnormality, atraumatic Neck: no adenopathy, supple, symmetrical, trachea midline and thyroid normal to inspection and palpation Lungs: clear to auscultation bilaterally Cardiovascular: regular rate and rhythm Breasts: normal appearance,  no masses or tenderness Abdomen: soft, non-tender; non distended,  no masses,  no organomegaly Extremities: extremities normal, atraumatic, no cyanosis or edema Skin: Skin color, texture, turgor normal. No rashes or lesions Lymph nodes: Cervical, supraclavicular, and axillary nodes normal. No abnormal inguinal nodes palpated Neurologic: Grossly normal   Pelvic: External genitalia:  no lesions              Urethra:  normal appearing urethra with no masses, tenderness or lesions              Bartholins and Skenes: normal                 Vagina: mildly atrophic appearing vagina with normal color and discharge, no lesions              Cervix: no lesions               Bimanual Exam:  Uterus:  normal size, contour, position, consistency, mobility, non-tender and anteverted              Adnexa: no mass, fullness,  tenderness               Rectovaginal: Confirms               Anus:  normal sphincter tone, no lesions  Arnold Long chaperoned for the exam.  A:  Well Woman with normal exam  Some entry dyspareunia even with lubricant, discussed the option of vaginal estrogen. She will call if she wants to try it.   P:   Pap with hpv  Mammogram and colonoscopy are UTD  Labs with primary  Discussed breast self exam  Discussed calcium and vit D intake    Addendum: patient's BP is elevated today, she hasn't taken her BP medication yet today. She just got a BP cuff at home and will start checking her BP at home. States her BP was normal with Dr Timothy Lasso in 8/21

## 2020-09-29 NOTE — Patient Instructions (Signed)

## 2020-09-30 ENCOUNTER — Other Ambulatory Visit (HOSPITAL_COMMUNITY)
Admission: RE | Admit: 2020-09-30 | Discharge: 2020-09-30 | Disposition: A | Discharge status: Home / Self Care | Payer: 59 | Source: Ambulatory Visit | Attending: Obstetrics and Gynecology | Admitting: Obstetrics and Gynecology

## 2020-09-30 DIAGNOSIS — Z01419 Encounter for gynecological examination (general) (routine) without abnormal findings: Secondary | ICD-10-CM | POA: Diagnosis present

## 2020-09-30 DIAGNOSIS — Z124 Encounter for screening for malignant neoplasm of cervix: Secondary | ICD-10-CM | POA: Diagnosis present

## 2020-09-30 NOTE — Addendum Note (Signed)
Addended by: Isabell Jarvis on: 09/30/2020 09:27 AM   Modules accepted: Orders

## 2020-10-05 LAB — CYTOLOGY - PAP
Comment: NEGATIVE
Diagnosis: NEGATIVE
High risk HPV: NEGATIVE

## 2021-03-07 ENCOUNTER — Encounter: Payer: Self-pay | Admitting: General Practice

## 2021-07-28 ENCOUNTER — Encounter (HOSPITAL_COMMUNITY): Payer: Self-pay | Admitting: Emergency Medicine

## 2021-07-28 ENCOUNTER — Emergency Department (HOSPITAL_COMMUNITY): Payer: 59

## 2021-07-28 ENCOUNTER — Observation Stay (HOSPITAL_COMMUNITY)
Admission: EM | Admit: 2021-07-28 | Discharge: 2021-07-29 | Disposition: A | Discharge status: Home / Self Care | Payer: 59 | Attending: Cardiology | Admitting: Cardiology

## 2021-07-28 DIAGNOSIS — R072 Precordial pain: Secondary | ICD-10-CM

## 2021-07-28 DIAGNOSIS — R55 Syncope and collapse: Secondary | ICD-10-CM | POA: Insufficient documentation

## 2021-07-28 DIAGNOSIS — Z79899 Other long term (current) drug therapy: Secondary | ICD-10-CM | POA: Insufficient documentation

## 2021-07-28 DIAGNOSIS — Z888 Allergy status to other drugs, medicaments and biological substances status: Secondary | ICD-10-CM | POA: Insufficient documentation

## 2021-07-28 DIAGNOSIS — I10 Essential (primary) hypertension: Secondary | ICD-10-CM | POA: Diagnosis not present

## 2021-07-28 DIAGNOSIS — I209 Angina pectoris, unspecified: Secondary | ICD-10-CM

## 2021-07-28 DIAGNOSIS — Z20822 Contact with and (suspected) exposure to covid-19: Secondary | ICD-10-CM | POA: Diagnosis not present

## 2021-07-28 DIAGNOSIS — E7800 Pure hypercholesterolemia, unspecified: Secondary | ICD-10-CM | POA: Diagnosis not present

## 2021-07-28 DIAGNOSIS — E785 Hyperlipidemia, unspecified: Secondary | ICD-10-CM

## 2021-07-28 DIAGNOSIS — R079 Chest pain, unspecified: Secondary | ICD-10-CM | POA: Diagnosis present

## 2021-07-28 DIAGNOSIS — I2510 Atherosclerotic heart disease of native coronary artery without angina pectoris: Secondary | ICD-10-CM

## 2021-07-28 DIAGNOSIS — K760 Fatty (change of) liver, not elsewhere classified: Secondary | ICD-10-CM | POA: Diagnosis not present

## 2021-07-28 DIAGNOSIS — I25118 Atherosclerotic heart disease of native coronary artery with other forms of angina pectoris: Principal | ICD-10-CM | POA: Insufficient documentation

## 2021-07-28 DIAGNOSIS — Z882 Allergy status to sulfonamides status: Secondary | ICD-10-CM | POA: Diagnosis not present

## 2021-07-28 DIAGNOSIS — R0789 Other chest pain: Secondary | ICD-10-CM | POA: Diagnosis present

## 2021-07-28 HISTORY — DX: Atherosclerotic heart disease of native coronary artery without angina pectoris: I25.10

## 2021-07-28 LAB — CBC
HCT: 38.5 % (ref 36.0–46.0)
Hemoglobin: 12.4 g/dL (ref 12.0–15.0)
MCH: 29.7 pg (ref 26.0–34.0)
MCHC: 32.2 g/dL (ref 30.0–36.0)
MCV: 92.1 fL (ref 80.0–100.0)
Platelets: 239 10*3/uL (ref 150–400)
RBC: 4.18 MIL/uL (ref 3.87–5.11)
RDW: 11.9 % (ref 11.5–15.5)
WBC: 6 10*3/uL (ref 4.0–10.5)
nRBC: 0 % (ref 0.0–0.2)

## 2021-07-28 LAB — BASIC METABOLIC PANEL
Anion gap: 7 (ref 5–15)
BUN: 12 mg/dL (ref 6–20)
CO2: 28 mmol/L (ref 22–32)
Calcium: 9.8 mg/dL (ref 8.9–10.3)
Chloride: 105 mmol/L (ref 98–111)
Creatinine, Ser: 0.94 mg/dL (ref 0.44–1.00)
GFR, Estimated: >60 mL/min (ref >60)
Glucose, Bld: 133 mg/dL — ABNORMAL HIGH (ref 70–99)
Potassium: 4.2 mmol/L (ref 3.5–5.1)
Sodium: 140 mmol/L (ref 135–145)

## 2021-07-28 LAB — TROPONIN I (HIGH SENSITIVITY)
Troponin I (High Sensitivity): 3 ng/L (ref <18)
Troponin I (High Sensitivity): 3 ng/L (ref <18)

## 2021-07-28 MED ORDER — ONDANSETRON HCL 4 MG/2ML IJ SOLN: 4.0000 mg | Freq: Four times a day (QID) | INTRAMUSCULAR | Status: DC | PRN

## 2021-07-28 MED ORDER — ASPIRIN 81 MG PO CHEW
324.0000 mg | CHEWABLE_TABLET | Freq: Once | ORAL | Status: AC
  Administered 2021-07-28: 324 mg via ORAL
  Filled 2021-07-28: qty 4

## 2021-07-28 MED ORDER — ACETAMINOPHEN 325 MG PO TABS: 650.0000 mg | ORAL_TABLET | ORAL | Status: DC | PRN

## 2021-07-28 NOTE — ED Provider Notes (Signed)
Emergency Medicine Provider Triage Evaluation Note  Gabrielle Ibarra , a 55 y.o. female  was evaluated in triage.  Pt complains of chest pain that started 2 hours ago, came on suddenly, states she had pain from her chest down into her pelvic bone, she became diaphoretic fatigue some shortness of breath.  Patient states pain has improved, she just has pain on the left side of her neck down to her clavicle,  no cardiac history, no history of PEs or DVTs, on anticoagulant no history of connective tissue disorders or Murfin disease..  Review of Systems  Positive: Chest pain, neck pain Negative: Shortness of breath, abdominal pain  Physical Exam  BP (!) 166/90 (BP Location: Left Arm)   Pulse 88   Resp 18   LMP 10/31/2006   SpO2 94%  Gen:   Awake, no distress   Resp:  Normal effort  MSK:   Moves extremities without difficulty  Other:    Medical Decision Making  Medically screening exam initiated at 2:10 PM.  Appropriate orders placed.  Shaleta Ruacho was informed that the remainder of the evaluation will be completed by another provider, this initial triage assessment does not replace that evaluation, and the importance of remaining in the ED until their evaluation is complete.  Presents with chest pain, patient will need further work-up.   Carroll Sage, PA-C 07/28/21 1412    Horton, Clabe Seal, DO 07/29/21 [phone]

## 2021-07-28 NOTE — ED Provider Notes (Signed)
[facility] EMERGENCY DEPARTMENT Provider Note   CSN: [id number] Arrival date & time: 07/28/21  1310     History Chief Complaint  Patient presents with   Near Syncope    Gabrielle Ibarra is a 56 y.o. female.  Patient is a 55 year old female with past medical history of HLD, hypertension who is presenting for chest pain that started at 12:30 pm today.  Patient states that she was cleaning her house today when she had acute chest pain moving left side of her chest rating to her neck and left shoulder.  She states that she felt chest pain all the way down to the lower pelvis that has since resolved.  She felt diaphoretic and nauseous.  She had some shortness of breath with this chest pain.   Near Syncope Associated symptoms include chest pain and shortness of breath. Pertinent negatives include no abdominal pain and no headaches.  Chest Pain Pain location:  L chest Pain quality: aching   Pain radiates to:  L jaw Onset quality:  Sudden Duration:  3 hours Progression:  Improving Chronicity:  New Relieved by:  None tried Associated symptoms: diaphoresis, nausea, near-syncope and shortness of breath   Associated symptoms: no abdominal pain, no AICD problem, no altered mental status, no anorexia, no anxiety, no back pain, no claudication, no cough, no dizziness, no dysphagia, no fatigue, no fever, no headache, no heartburn, no lower extremity edema, no numbness, no orthopnea, no palpitations, no PND, no syncope, no vomiting and no weakness   Risk factors: no Ehlers-Danlos syndrome and no Marfan's syndrome    HPI: A 55 year old patient with a history of hypertension and hypercholesterolemia presents for evaluation of chest pain. Initial onset of pain was approximately 1-3 hours ago. The patient's chest pain is described as heaviness/pressure/tightness and is not worse with exertion. The patient complains of nausea and reports some diaphoresis. The patient's chest pain is  middle- or left-sided, is not well-localized, is not sharp and does radiate to the arms/jaw/neck. The patient has a family history of coronary artery disease in a first-degree relative with onset less than age 73. The patient has no history of stroke, has no history of peripheral artery disease, has not smoked in the past 90 days, denies any history of treated diabetes and does not have an elevated BMI (>=30).   Past Medical History:  Diagnosis Date   Abnormal Pap smear of cervix 2000   ASCUS   Anxiety    Back pain    Depression    Hyperlipidemia    Hypertension 2013    Patient Active Problem List   Diagnosis Date Noted   Chest pain 07/28/2021   Decreased libido 02/13/2014   Situational stress 02/13/2014   Pain in joint, upper arm 02/13/2014    Past Surgical History:  Procedure Laterality Date   ADENOIDECTOMY     CESAREAN SECTION     x 1     OB History     Gravida  3   Para  3   Term  3   Preterm  0   AB  0   Living  3      SAB  0   IAB  0   Ectopic  0   Multiple  0   Live Births  3           Family History  Problem Relation Age of Onset   Breast cancer Mother 46   Hyperlipidemia Mother    Hypertension Mother  Hyperlipidemia Father    Hypertension Father     Social History   Tobacco Use   Smoking status: Never   Smokeless tobacco: Never  Vaping Use   Vaping Use: Never used  Substance Use Topics   Alcohol use: Yes    Comment: occ   Drug use: No    Home Medications Prior to Admission medications   Medication Sig Start Date End Date Taking? Authorizing Provider  ALPRAZolam Prudy Feeler) 0.5 MG tablet Take 0.5 mg by mouth at bedtime as needed for anxiety.   Yes [provider]  doxycycline (VIBRAMYCIN) 50 MG capsule Take 50 mg by mouth daily as needed (roscea). 09/02/17  Yes [provider]  fluticasone (FLONASE) 50 MCG/ACT nasal spray Place 1 spray into both nostrils daily as needed for allergies. 08/06/17  Yes [provider]  losartan (COZAAR) 50 MG tablet Take 50 mg by mouth daily.   Yes [provider]  montelukast (SINGULAIR) 10 MG tablet Take 10 mg by mouth daily. 07/03/21  Yes [provider]  rosuvastatin (CRESTOR) 10 MG tablet Take 10 mg by mouth daily. 06/03/21  Yes [provider]  venlafaxine XR (EFFEXOR-XR) 37.5 MG 24 hr capsule Take 37.5 mg by mouth daily. 09/28/20  Yes [provider]  zolpidem (AMBIEN) 10 MG tablet TAKE 1 TABLET BY MOUTH AT BEDTIME AS NEEDED FOR SLEEP 05/04/17  Yes Ria Comment, [name]    Allergies    Sulfa antibiotics and Zoloft [sertraline hcl]  Review of Systems   Review of Systems  Constitutional:  Positive for diaphoresis. Negative for chills, fatigue and fever.  HENT:  Negative for congestion, ear pain, sore throat and trouble swallowing.   Eyes:  Negative for pain and visual disturbance.  Respiratory:  Positive for shortness of breath. Negative for cough.   Cardiovascular:  Positive for chest pain and near-syncope. Negative for palpitations, orthopnea, claudication, syncope and PND.  Gastrointestinal:  Positive for nausea. Negative for abdominal pain, anorexia, heartburn and vomiting.  Genitourinary:  Negative for dysuria and hematuria.  Musculoskeletal:  Negative for arthralgias, back pain and myalgias.  Skin:  Negative for color change and rash.  Neurological:  Negative for dizziness, seizures, syncope, weakness, numbness and headaches.  All other systems reviewed and are negative.  Physical Exam Updated Vital Signs BP (!) 162/91   Pulse 85   Temp 98.7 F (37.1 C) (Oral)   Resp 19   LMP 10/31/2006   SpO2 97%   Physical Exam Vitals and nursing note reviewed.  Constitutional:      General: She is not in acute distress.    Appearance: Normal appearance. She is well-developed.  HENT:     Head: Normocephalic and atraumatic.     Right Ear: External ear normal.     Left Ear: External ear normal.     Nose: Nose  normal.     Mouth/Throat:     Mouth: Mucous membranes are moist.     Pharynx: Oropharynx is clear.  Eyes:     Extraocular Movements: Extraocular movements intact.     Conjunctiva/sclera: Conjunctivae normal.     Pupils: Pupils are equal, round, and reactive to light.  Neck:     Vascular: No carotid bruit.  Cardiovascular:     Rate and Rhythm: Normal rate and regular rhythm.     Heart sounds: No murmur heard. Pulmonary:     Effort: Pulmonary effort is normal. No respiratory distress.     Breath sounds: Normal breath sounds. No stridor.  No wheezing, rhonchi or rales.  Chest:     Chest wall: No tenderness.  Abdominal:     General: Abdomen is flat. Bowel sounds are normal.     Palpations: Abdomen is soft.     Tenderness: There is no abdominal tenderness. There is no guarding.  Musculoskeletal:        General: No swelling. Normal range of motion.     Cervical back: Normal range of motion and neck supple. No rigidity.     Right lower leg: No edema.     Left lower leg: No edema.  Skin:    General: Skin is warm and dry.  Neurological:     General: No focal deficit present.     Mental Status: She is alert and oriented to person, place, and time. Mental status is at baseline.     Cranial Nerves: No cranial nerve deficit.     Sensory: No sensory deficit.     Motor: No weakness.     Gait: Gait normal.  Psychiatric:        Mood and Affect: Mood normal.        Behavior: Behavior normal.        Thought Content: Thought content normal.        Judgment: Judgment normal.    ED Results / Procedures / Treatments   Labs (all labs ordered are listed, but only abnormal results are displayed) Labs Reviewed  BASIC METABOLIC PANEL - Abnormal; Notable for the following components:      Result Value   Glucose, Bld 133 (*)    All other components within normal limits  CBC  HIV ANTIBODY (ROUTINE TESTING W REFLEX)  TROPONIN I (HIGH SENSITIVITY)  TROPONIN I (HIGH SENSITIVITY)    EKG EKG  Interpretation  Date/Time:  Thursday July 28 2021 13:52:00 EDT Ventricular Rate:  87 PR Interval:  138 QRS Duration: 94 QT Interval:  344 QTC Calculation: 413 R Axis:   26 Text Interpretation: Normal sinus rhythm Cannot rule out Anterior infarct , age undetermined Abnormal ECG No acute changes No significant change since last tracing Confirmed by Derwood Kaplananavati, Ankit [phone]([id number]) on 07/28/2021 5:29:20 PM  Radiology DG Chest 2 View  Result Date: 07/28/2021 CLINICAL DATA:  Chest pain EXAM: CHEST - 2 VIEW COMPARISON:  04/11/2012 FINDINGS: The heart size and mediastinal contours are within normal limits. Both lungs are clear. The visualized skeletal structures are unremarkable. IMPRESSION: No acute abnormality of the lungs. Electronically Signed   By: Lauralyn PrimesAlex  Bibbey M.D.   On: 07/28/2021 14:47    Procedures Procedures   Medications Ordered in ED Medications  acetaminophen (TYLENOL) tablet 650 mg (has no administration in time range)  ondansetron (ZOFRAN) injection 4 mg (has no administration in time range)  aspirin chewable tablet 324 mg (324 mg Oral Given 07/28/21 1916)    ED Course  I have reviewed the triage vital signs and the nursing notes.  Pertinent labs & imaging results that were available during my care of the patient were reviewed by me and considered in my medical decision making (see chart for details).    MDM Rules/Calculators/A&P HEAR Score: 5                       Patient is a 55 year old female presenting with chest pain for the last 3 hours.  Patient's history concerning for ACS.  She has no history of cardiac history, history of PEs or DVTs.  She is not on any anticoagulation.  Hear score of 5. Patient's troponin x2 was negative. Her EKG showed no signs of ischemia. Her CBC and CMP are unremarkable. CXR showed no acute cardiac or pulmonary abnormality. Cardiology consulted due to concern for ACS. Patient has equal pulses in all four extremities. Patient does not get pain relief by  leaning forward. Cardiology evaluated the patient and recommending admission. Final Clinical Impression(s) / ED Diagnoses Final diagnoses:  Angina pectoris (HCC)  Chest pain, unspecified type    Rx / DC Orders ED Discharge Orders     None        Lottie Dawson, MD 07/28/21 2330    Derwood Kaplan, MD 07/29/21 [phone]

## 2021-07-28 NOTE — ED Notes (Signed)
Pt here with sudden onset weakness/L sided chest pain that radiated up to jaw and caused her to be diaphoretic and have nausea for a couple of minutes. Was on her feet all day and was walking to clean a table. She said the last 30 minutes she has felt better, but she still has some jaw pain on the left side and some slight nausea. No cardiac hx, but significant family hx. HTN controlled with medication. Pt AxO x4.

## 2021-07-28 NOTE — ED Notes (Signed)
Report called to RN receiving pt. 

## 2021-07-28 NOTE — ED Notes (Signed)
When pt got back from ambulating to restroom she was sweaty and slightly out of breath. Once she got back into bed it dissipated.

## 2021-07-28 NOTE — H&P (Signed)
CARDIOLOGY ADMISSION NOTE  Patient ID: Gabrielle Ibarra MRN: [id number] DOB/AGE: 1966/12/19 55 y.o.  Admit date: 07/28/2021 Primary Physician   Creola Corn, MD Primary Cardiologist   None Chief Complaint     Chest pain  HPI:   The patient has has some left sided discomfort recently and was going to have a POET (Plain Old Exercise Treadmill) as ordered by Creola Corn, MD.  This was earlier this year but she has not followed through.  She does have cardiovascular risk factors.  Today she had chest pain.  She was standing and about to clean a table but was not active.  She had severe mid chest pain with radiation to the left arm.  She thinks that this lasted for many seconds.  She did not recall whether it was sharp or dull she has never had this type of pain before.  It was intense and she afterward felt very fatigued and had immediate diaphoresis.   The pain went away spontaneously.  She did not notice that it was worse with breathing or movement.  Trop was negative x 2 in the ED and EKG was not acute.    She has otherwise been OK.  Besides the event today the patient denies any new symptoms such as chest discomfort, neck or arm discomfort. There has been no new shortness of breath, PND or orthopnea. There have been no reported palpitations, presyncope or syncope.   Past Medical History:  Diagnosis Date   Abnormal Pap smear of cervix 2000   ASCUS   Anxiety    Back pain    Depression    Hyperlipidemia    Hypertension 2013    Past Surgical History:  Procedure Laterality Date   ADENOIDECTOMY     CESAREAN SECTION     x 1    Allergies  Allergen Reactions   Sulfa Antibiotics Other (See Comments)    Stomach pains   Zoloft [Sertraline Hcl]     Psychological reaction   No current facility-administered medications on file prior to encounter.   Current Outpatient Medications on File Prior to Encounter  Medication Sig Dispense Refill   ALPRAZolam (XANAX) 0.5 MG tablet Take 0.5 mg by  mouth at bedtime as needed for anxiety.     doxycycline (VIBRAMYCIN) 50 MG capsule Take 50 mg by mouth daily as needed (roscea).  2   fluticasone (FLONASE) 50 MCG/ACT nasal spray Place 1 spray into both nostrils daily as needed for allergies.  1   losartan (COZAAR) 50 MG tablet Take 50 mg by mouth daily.     montelukast (SINGULAIR) 10 MG tablet Take 10 mg by mouth daily.     rosuvastatin (CRESTOR) 10 MG tablet Take 10 mg by mouth daily.     venlafaxine XR (EFFEXOR-XR) 37.5 MG 24 hr capsule Take 37.5 mg by mouth daily.     zolpidem (AMBIEN) 10 MG tablet TAKE 1 TABLET BY MOUTH AT BEDTIME AS NEEDED FOR SLEEP 30 tablet 3   Social History   Socioeconomic History   Marital status: Married    Spouse name: Not on file   Number of children: 3   Years of education: Not on file   Highest education level: Not on file  Occupational History    Employer: [employer]  Tobacco Use   Smoking status: Never   Smokeless tobacco: Never  Vaping Use   Vaping Use: Never used  Substance and Sexual Activity   Alcohol use: Yes    Comment: occ  Drug use: No   Sexual activity: Yes    Partners: Male    Birth control/protection: Post-menopausal  Other Topics Concern   Not on file  Social History Narrative   Not on file   Social Determinants of Health   Financial Resource Strain: Not on file  Food Insecurity: Not on file  Transportation Needs: Not on file  Physical Activity: Not on file  Stress: Not on file  Social Connections: Not on file  Intimate Partner Violence: Not on file    Family History  Problem Relation Age of Onset   Breast cancer Mother 55   Hyperlipidemia Mother    Hypertension Mother    Hyperlipidemia Father    Hypertension Father      ROS:  As stated in the HPI and negative for all other systems.  Physical Exam: Blood pressure (!) 147/96, pulse 86, temperature 98.7 F (37.1 C), temperature source Oral, resp. rate 18, last menstrual period 10/31/2006, SpO2 100 %.   GENERAL:  Well appearing HEENT:  Pupils equal round and reactive, fundi not visualized, oral mucosa unremarkable NECK:  No jugular venous distention, waveform within normal limits, carotid upstroke brisk and symmetric, no bruits, no thyromegaly LYMPHATICS:  No cervical, inguinal adenopathy LUNGS:  Clear to auscultation bilaterally BACK:  No CVA tenderness CHEST:  Unremarkable HEART:  PMI not displaced or sustained,S1 and S2 within normal limits, no S3, no S4, no clicks, no rubs, no murmurs ABD:  Flat, positive bowel sounds normal in frequency in pitch, no bruits, no rebound, no guarding, no midline pulsatile mass, no hepatomegaly, no splenomegaly EXT:  2 plus pulses throughout, no edema, no cyanosis no clubbing SKIN:  No rashes no nodules NEURO:  Cranial nerves II through XII grossly intact, motor grossly intact throughout PSYCH:  Cognitively intact, oriented to person place and time   Labs: Lab Results  Component Value Date   BUN 12 07/28/2021   Lab Results  Component Value Date   CREATININE 0.94 07/28/2021   Lab Results  Component Value Date   NA 140 07/28/2021   K 4.2 07/28/2021   CL 105 07/28/2021   CO2 28 07/28/2021   Lab Results  Component Value Date   TROPONINI 0.02        NO INDICATION OF MYOCARDIAL INJURY. 09/07/2008   Lab Results  Component Value Date   WBC 6.0 07/28/2021   HGB 12.4 07/28/2021   HCT 38.5 07/28/2021   MCV 92.1 07/28/2021   PLT 239 07/28/2021   Lab Results  Component Value Date   CHOL 234 (H) 03/12/2017   HDL 63 03/12/2017   LDLCALC 145 (H) 03/12/2017   TRIG 132 03/12/2017   CHOLHDL 3.7 03/12/2017   Lab Results  Component Value Date   ALT 17 03/12/2017   AST 18 03/12/2017   ALKPHOS 78 03/12/2017   BILITOT 0.4 03/12/2017      Radiology:  CXR:  No acute disease  EKG:   NSR, rate 87, axis WNL, intervals WNL, no acute ST T wave changes.    ASSESSMENT AND PLAN:    Chest pain :    The patient has pain that has typical and  atypical features for angina.  She has risk factors including a significant family history.  The pretest probability of obstructive CAD is at least moderate.  Coronary CT would be an excellent test.  Observe, treat with her previous meds and ASA  HTN:  BP is elevated but she reports that it is well controlled at  home.   Continue current therapy.  DYSLIPIDEMIA:  I will defer management to Creola Corn, MD.  Continue current meds.   SignedRollene Rotunda 07/28/2021, 6:25 PM

## 2021-07-28 NOTE — ED Notes (Signed)
Pt ambulatory to restroom. No diaphoresis this time. Given a new sheet. Lights dimmed. Pt given water. Pt denies further needs.

## 2021-07-28 NOTE — ED Notes (Signed)
Pt ambulatory to restroom. Steady on feet. Denies further needs. 

## 2021-07-28 NOTE — ED Notes (Signed)
Report given to Lamont, RN 

## 2021-07-28 NOTE — ED Notes (Signed)
Pt resting in bed. Denies further needs.

## 2021-07-28 NOTE — ED Notes (Signed)
Pt given gingerale and fruit cup. Pt denies further needs.

## 2021-07-28 NOTE — ED Triage Notes (Signed)
Patient here for evaluation of sudden onset of generalized weakness and chest pain that started at approximately 1230 today. Patient also reports shortness of breath and nausea.

## 2021-07-29 ENCOUNTER — Encounter (HOSPITAL_COMMUNITY): Payer: Self-pay | Admitting: Cardiology

## 2021-07-29 ENCOUNTER — Observation Stay (HOSPITAL_COMMUNITY): Payer: 59

## 2021-07-29 ENCOUNTER — Other Ambulatory Visit: Payer: Self-pay

## 2021-07-29 DIAGNOSIS — K760 Fatty (change of) liver, not elsewhere classified: Secondary | ICD-10-CM

## 2021-07-29 DIAGNOSIS — R072 Precordial pain: Secondary | ICD-10-CM | POA: Diagnosis not present

## 2021-07-29 DIAGNOSIS — I2510 Atherosclerotic heart disease of native coronary artery without angina pectoris: Secondary | ICD-10-CM

## 2021-07-29 DIAGNOSIS — R079 Chest pain, unspecified: Secondary | ICD-10-CM | POA: Diagnosis not present

## 2021-07-29 DIAGNOSIS — I10 Essential (primary) hypertension: Secondary | ICD-10-CM

## 2021-07-29 DIAGNOSIS — E785 Hyperlipidemia, unspecified: Secondary | ICD-10-CM

## 2021-07-29 LAB — SARS CORONAVIRUS 2 (TAT 6-24 HRS): SARS Coronavirus 2: NEGATIVE

## 2021-07-29 LAB — HIV ANTIBODY (ROUTINE TESTING W REFLEX): HIV Screen 4th Generation wRfx: NONREACTIVE

## 2021-07-29 MED ORDER — IOHEXOL 350 MG/ML SOLN
100.0000 mL | Freq: Once | INTRAVENOUS | Status: AC | PRN
  Administered 2021-07-29: 100 mL via INTRAVENOUS

## 2021-07-29 MED ORDER — ASPIRIN EC 81 MG PO TBEC: 81.0000 mg | DELAYED_RELEASE_TABLET | Freq: Every day | ORAL | Status: DC

## 2021-07-29 MED ORDER — MONTELUKAST SODIUM 10 MG PO TABS: 10.0000 mg | ORAL_TABLET | Freq: Every day | ORAL | Status: DC

## 2021-07-29 MED ORDER — ALPRAZOLAM 0.5 MG PO TABS: 0.5000 mg | ORAL_TABLET | Freq: Every evening | ORAL | Status: DC | PRN

## 2021-07-29 MED ORDER — ROSUVASTATIN CALCIUM 5 MG PO TABS
10.0000 mg | ORAL_TABLET | Freq: Every day | ORAL | Status: DC
  Administered 2021-07-29: 10 mg via ORAL
  Filled 2021-07-29: qty 2

## 2021-07-29 MED ORDER — ZOLPIDEM TARTRATE 5 MG PO TABS: 10.0000 mg | ORAL_TABLET | Freq: Every evening | ORAL | Status: DC | PRN

## 2021-07-29 MED ORDER — METOPROLOL TARTRATE 5 MG/5ML IV SOLN
INTRAVENOUS | Status: AC
  Administered 2021-07-29: 5 mg
  Filled 2021-07-29: qty 5

## 2021-07-29 MED ORDER — DOXYCYCLINE HYCLATE 50 MG PO CAPS
50.0000 mg | ORAL_CAPSULE | Freq: Every day | ORAL | Status: DC | PRN
  Filled 2021-07-29: qty 1

## 2021-07-29 MED ORDER — LOSARTAN POTASSIUM 50 MG PO TABS
50.0000 mg | ORAL_TABLET | Freq: Every day | ORAL | Status: DC
  Administered 2021-07-29: 50 mg via ORAL
  Filled 2021-07-29: qty 1

## 2021-07-29 MED ORDER — VENLAFAXINE HCL ER 37.5 MG PO CP24
37.5000 mg | ORAL_CAPSULE | Freq: Every day | ORAL | Status: DC
  Administered 2021-07-29: 37.5 mg via ORAL
  Filled 2021-07-29: qty 1

## 2021-07-29 MED ORDER — NITROGLYCERIN 0.4 MG SL SUBL
SUBLINGUAL_TABLET | SUBLINGUAL | Status: AC
  Administered 2021-07-29: 0.8 mg
  Filled 2021-07-29: qty 2

## 2021-07-29 MED ORDER — FLUTICASONE PROPIONATE 50 MCG/ACT NA SUSP: 1.0000 | Freq: Every day | NASAL | Status: DC | PRN

## 2021-07-29 NOTE — Discharge Summary (Signed)
Discharge Summary    Patient ID: Gabrielle Ibarra MRN: [id number]; DOB: 07-Aug-1966  Admit date: 07/28/2021 Discharge date: 07/29/2021  PCP:  Creola Corn, MD   [facility] Providers Cardiologist:  Rollene Rotunda, MD        Discharge Diagnoses    Principal Problem:   Chest pain Active Problems:   Nonobstructive atherosclerosis of coronary artery   Essential hypertension   Dyslipidemia   Hepatic steatosis    Diagnostic Studies/Procedures    See CT Scan Report as below _____________   History of Present Illness     Gabrielle Ibarra is a 55 y.o. female with elevated coronary calcium score by CT 08/2019, HTN, HLD, back pain, anxiety, depression presented to [facility] with chest pain. She had had some left sided discomfort lately and was going to have a POET earlier this year but had not yet followed through. Yesterday she had severe mid chest pain with radiation to the arm. It lasted for several seconds. She thinks that this lasted for many seconds. She did not recall whether it was sharp or dull she has never had this type of pain before. It was intense and she afterward felt very fatigued and had immediate diaphoresis. The pain went away spontaneously. There was some lingering discomfort in the ED. She did not notice that it was worse with breathing or movement. Trop was negative x 2 in the ED and EKG was not acute. She was admitted for observation.   Hospital Course     1. Chest pain with mixed features / nonobstructive coronary artery disease - troponins negative - Coronary CT scan showed calcium score of 232 (98th percentile) with mild nonobstructive CAD with 40% plaque in prox LAD,  luminal irregularities in the RCA and LCx, mild plaque at the bifurcation of the ramus, study sent for FFR for completeness - per Dr. Antoine Poche, OK to discharge home with risk factor modification and initiation of ASA 81mg  daily   2. Essential HTN - BP elevated here but pt reports good control at  home - the patient was instructed to monitor their blood pressure at home and to call if tending to run higher than 130/80   3. Dyslipidemia - continue rosuvastatin - patient to follow up as outpatient with primary care for further lipid management, advise goal LDL <70  4. Hepatic steatosis - seen on CT, finding d/w pt, advised to f/u PCP  Dr. has seen and examined the patient today and feels she is stable for discharge. Follow-up has been arranged for September with our office.  Did the patient have an acute coronary syndrome (MI, NSTEMI, STEMI, etc) this admission?:  No                               Did the patient have a percutaneous coronary intervention (stent / angioplasty)?:  No.       _____________  Discharge Vitals Blood pressure 130/86, pulse 69, temperature 97.6 F (36.4 C), temperature source Oral, resp. rate 18, last menstrual period 10/31/2006, SpO2 97 %.  There were no vitals filed for this visit.  Labs & Radiologic Studies    CBC Recent Labs    07/28/21 1400  WBC 6.0  HGB 12.4  HCT 38.5  MCV 92.1  PLT 239   Basic Metabolic Panel Recent Labs    09/27/21 1400  NA 140  K 4.2  CL 105  CO2 28  GLUCOSE 133*  BUN  12  CREATININE 0.94  CALCIUM 9.8   Liver Function Tests No results for input(s): AST, ALT, ALKPHOS, BILITOT, PROT, ALBUMIN in the last 72 hours. No results for input(s): LIPASE, AMYLASE in the last 72 hours. High Sensitivity Troponin:   Recent Labs  Lab 07/28/21 1400 07/28/21 1605  TROPONINIHS 3 3     _____________  DG Chest 2 View  Result Date: 07/28/2021 CLINICAL DATA:  Chest pain EXAM: CHEST - 2 VIEW COMPARISON:  04/11/2012 FINDINGS: The heart size and mediastinal contours are within normal limits. Both lungs are clear. The visualized skeletal structures are unremarkable. IMPRESSION: No acute abnormality of the lungs. Electronically Signed   By: Lauralyn Primes M.D.   On: 07/28/2021 14:47   CT CORONARY MORPH W/CTA COR W/SCORE  W/CA W/CM &/OR WO/CM  Addendum Date: 07/29/2021   ADDENDUM REPORT: 07/29/2021 11:35 CLINICAL DATA:  55 Year-old White Female EXAM: Cardiac/Coronary  CTA TECHNIQUE: The patient was scanned on a [employer]. FINDINGS: A 100 kV prospective scan was triggered in the descending thoracic aorta at 111 HU's. Axial non-contrast 3 mm slices were carried out through the heart. The data set was analyzed on a dedicated work station and scored using the Agatson method. Gantry rotation speed was 250 msecs and collimation was .6 mm. No beta blockade and 0.8 mg of sl NTG was given. The 3D data set was reconstructed in 5% intervals of the 67-82 % of the R-R cycle. Diastolic phases were analyzed on a dedicated work station using MPR, MIP and VRT modes. The patient received 100 cc of contrast. Aorta:  Normal size.  No calcifications.  No dissection. Main Pulmonary Artery: Normal size of the pulmonary artery. Aortic Valve:  Tri-leaflet.  No calcifications. Coronary Arteries:  Normal coronary origin.  Right dominance. Coronary Calcium Score: Left main: 0 Left anterior descending artery: 199 Left circumflex artery: 8 Right coronary artery: 25 Total: 232 Percentile: 98th for age, sex, and race matched control. RCA is a large dominant artery that gives rise to PDA and PLA. There are luminal irregularities. Left main is a large artery that gives rise to LAD and LCX arteries. There is no significant plaque. LAD is a large vessel that gives rise to two diagonal vessels There is a mild non-obstructive (40%) calcified plaque in the proximal LAD. LCX is a non-dominant artery.  There are luminal irregularities. There is a ramus intermedius vessel that bifurcates There is a mild non-obstructive calcified plaque that the bifurcation. Other findings: Normal variant pulmonary vein drainage into the left atrium- left common pulmonary vein. Normal left atrial appendage without a thrombus. Extra-cardiac findings: See attached radiology report  for non-cardiac structures. Slab artifact noted. IMPRESSION: 1. Coronary calcium score of 232. This was 98th percentile for age, sex, and race matched control. 2. Normal coronary origin with right dominance. 3. CAD-RADS 2. Mild non-obstructive CAD (25-49%). CT-FFR sent for lesion greater than 40%. 4. Compared to 09/19/2019 calcium score study, calcium score has slightly increased. RECOMMENDATIONS: Coronary artery calcium (CAC) score is a strong predictor of incident coronary heart disease (CHD) and provides predictive information beyond traditional risk factors. CAC scoring is reasonable to use in the decision to withhold, postpone, or initiate statin therapy in intermediate-risk or selected borderline-risk asymptomatic adults (age 67-75 years and LDL-C >=70 to <190 mg/dL) who do not have diabetes or established atherosclerotic cardiovascular disease (ASCVD).* In intermediate-risk (10-year ASCVD risk >=7.5% to <20%) adults or selected borderline-risk (10-year ASCVD risk >=5% to <7.5%) adults in whom a  CAC score is measured for the purpose of making a treatment decision the following recommendations have been made: If CAC = 0, it is reasonable to withhold statin therapy and reassess in 5 to 10 years, as long as higher risk conditions are absent (diabetes mellitus, family history of premature CHD in first degree relatives (males <55 years; females <65 years), cigarette smoking, LDL >=190 mg/dL or other independent risk factors). If CAC is 1 to 99, it is reasonable to initiate statin therapy for patients >=71 years of age. If CAC is >=100 or >=75th percentile, it is reasonable to initiate statin therapy at any age. Cardiology referral should be considered for patients with CAC scores =400 or >=75th percentile. *2018 AHA/ACC/AACVPR/AAPA/ABC/ACPM/ADA/AGS/APhA/ASPC/NLA/PCNA Guideline on the Management of Blood Cholesterol: A Report of the American College of Cardiology/American Heart Association Task Force on Clinical  Practice Guidelines. J Am Coll Cardiol. 2019;73(24):3168-3209. Riley Lam, MD Electronically Signed   By: Riley Lam MD   On: 07/29/2021 11:35   Result Date: 07/29/2021 EXAM: OVER-READ INTERPRETATION  CT CHEST The following report is an over-read performed by radiologist Dr. Donzetta Kohut of [facility] on 07/29/2021. This over-read does not include interpretation of cardiac or coronary anatomy or pathology. The coronary calcium score/coronary CTA interpretation by the cardiologist is attached. Imaging targeted to cardiac structures with imaging of the mid chest on today's study. COMPARISON:  Chest pain, nonspecific chest pain in a 55 year old female. FINDINGS: Vascular: See dedicated report for cardiovascular details. Mediastinum/Nodes: Visualized mediastinum without signs of adenopathy or acute process on limited assessment. Lungs/Pleura: No consolidation. No pleural effusion. Visualized airways are patent. Upper Abdomen: Hepatic steatosis. No acute upper abdominal process on very limited assessment. Musculoskeletal: No acute musculoskeletal finding. Spinal degenerative changes. IMPRESSION: No acute findings in imaged portions of the chest, see dedicated report for cardiovascular details provided separately. Hepatic steatosis. Electronically Signed: By: Donzetta Kohut M.D. On: 07/29/2021 10:22   Disposition   Pt is being discharged home today in good condition. Warning sx reviewed with pt.  Follow-up Plans & Appointments     Follow-up Information     Creola Corn, MD Follow up.   Specialty: Internal Medicine Why: Please follow up with your primary care provider as we discussed. Contact information: [address] [place] [address] [phone]         Beatriz Stallion., PA-C Follow up.   Specialties: Physician Assistant, Cardiology Why: [facility] [place] location - Monday September 12, 2021 at 2:45 PM (Arrive by 2:30 PM). Dot Lanes is one of the  PAs that works closely with our cardiology team. Contact information: [address] [place] [address] [phone]                Discharge Instructions     Diet - low sodium heart healthy   Complete by: As directed    Discharge instructions   Complete by: As directed    Dr. Antoine Poche would like you to start a baby aspirin 81mg  daily for prevention purposes.  Please follow up with your primary care doctor about the finding of hepatic steatosis (fatty liver) on your CT scan.   We would also advise a goal LDL level of less than 70 in the future for your cholesterol.  We need to get a better idea of what your blood pressure is running at home. Here are some instructions to follow: - I would recommend using a blood pressure cuff that goes on your arm. The wrist  ones can be inaccurate. If you're purchasing one for the first time, try to select one that also reports your heart rate because this can be helpful information as well. - To check your blood pressure, choose a time at least 3 hours after taking your blood pressure medicines. If you can sample it at different times of the day, that's great - it might give you more information about how your blood pressure fluctuates. Remain seated in a chair for 5 minutes quietly beforehand, then check it.  - Call your doctor if you tend to get readings of greater than 130 on the top number or 80 on the bottom number.   Increase activity slowly   Complete by: As directed        Discharge Medications   Allergies as of 07/29/2021       Reactions   Sulfa Antibiotics Other (See Comments)   Stomach pains   Zoloft [sertraline Hcl]    Psychological reaction        Medication List     TAKE these medications    ALPRAZolam 0.5 MG tablet Commonly known as: XANAX Take 0.5 mg by mouth at bedtime as needed for anxiety.   aspirin EC 81 MG tablet Take 1 tablet (81 mg total) by mouth daily. Swallow whole.   doxycycline 50  MG capsule Commonly known as: VIBRAMYCIN Take 50 mg by mouth daily as needed (roscea).   fluticasone 50 MCG/ACT nasal spray Commonly known as: FLONASE Place 1 spray into both nostrils daily as needed for allergies.   losartan 50 MG tablet Commonly known as: COZAAR Take 50 mg by mouth daily.   montelukast 10 MG tablet Commonly known as: SINGULAIR Take 10 mg by mouth daily.   rosuvastatin 10 MG tablet Commonly known as: CRESTOR Take 10 mg by mouth daily.   venlafaxine XR 37.5 MG 24 hr capsule Commonly known as: EFFEXOR-XR Take 37.5 mg by mouth daily.   zolpidem 10 MG tablet Commonly known as: AMBIEN TAKE 1 TABLET BY MOUTH AT BEDTIME AS NEEDED FOR SLEEP           Outstanding Labs/Studies   N/A  Duration of Discharge Encounter   Greater than 30 minutes including physician time.  Signed, Laurann Montanaayna N Jeyson Deshotel, PA-C 07/29/2021, 3:51 PM

## 2021-07-29 NOTE — Progress Notes (Addendum)
Progress Note  Patient Name: Gabrielle Ibarra Date of Encounter: 07/29/2021  Primary Cardiologist: New to Dr. Antoine Poche  Subjective   Feeling well this AM, no complaints. The last of the chest/neck pain seemed to have disappeared overnight.  Inpatient Medications    Scheduled Meds:  Continuous Infusions:  PRN Meds: acetaminophen, ondansetron (ZOFRAN) IV   Vital Signs    Vitals:   07/28/21 2143 07/28/21 2300 07/29/21 0026 07/29/21 0455  BP: (!) 162/91 (!) 156/92 (!) 156/92 138/87  Pulse: 85 73 72 72  Resp: 19 15 17 17   Temp:   98.1 F (36.7 C) 98.3 F (36.8 C)  TempSrc:   Oral Oral  SpO2: 97% 96% 97% 96%   No intake or output data in the 24 hours ending 07/29/21 0939 Last 3 Weights 09/29/2020 08/28/2019 09/17/2017  Weight (lbs) 176 lb 8 oz 165 lb 9.6 oz 157 lb 8 oz  Weight (kg) 80.06 kg 75.116 kg 71.442 kg     Telemetry    NSR - Personally Reviewed  Physical Exam   GEN: No acute distress.  HEENT: Normocephalic, atraumatic, sclera non-icteric. Neck: No JVD or bruits. Cardiac: RRR no murmurs, rubs, or gallops.  Respiratory: Clear to auscultation bilaterally. Breathing is unlabored. GI: Soft, nontender, non-distended, BS +x 4. MS: no deformity. Extremities: No clubbing or cyanosis. No edema. Distal pedal pulses are 2+ and equal bilaterally. Neuro:  AAOx3. Follows commands. Psych:  Responds to questions appropriately with a normal affect.  Labs    High Sensitivity Troponin:   Recent Labs  Lab 07/28/21 1400 07/28/21 1605  TROPONINIHS 3 3      Cardiac EnzymesNo results for input(s): TROPONINI in the last 168 hours. No results for input(s): TROPIPOC in the last 168 hours.   Chemistry Recent Labs  Lab 07/28/21 1400  NA 140  K 4.2  CL 105  CO2 28  GLUCOSE 133*  BUN 12  CREATININE 0.94  CALCIUM 9.8  GFRNONAA >60  ANIONGAP 7     Hematology Recent Labs  Lab 07/28/21 1400  WBC 6.0  RBC 4.18  HGB 12.4  HCT 38.5  MCV 92.1  MCH 29.7  MCHC 32.2   RDW 11.9  PLT 239    BNPNo results for input(s): BNP, PROBNP in the last 168 hours.   DDimer No results for input(s): DDIMER in the last 168 hours.   Radiology    DG Chest 2 View  Result Date: 07/28/2021 CLINICAL DATA:  Chest pain EXAM: CHEST - 2 VIEW COMPARISON:  04/11/2012 FINDINGS: The heart size and mediastinal contours are within normal limits. Both lungs are clear. The visualized skeletal structures are unremarkable. IMPRESSION: No acute abnormality of the lungs. Electronically Signed   By: 04/13/2012 M.D.   On: 07/28/2021 14:47    Cardiac Studies   Pending cor CT  Patient Profile     55 y.o. female with HTN, HLD, back pain, anxiety, depression presented to [facility] with chest pain with radiation to left arm and neck. Troponins negative.  Assessment & Plan    1. Chest pain with mixed features - troponins negative - spoke with [facility], RN with CT team - will plan coronary CTA this AM. Per our discussion, hold off PO metoprolol given that study can be done shortly - can have IV metoprolol down in scanner  2. Essential HTN - BP elevated here but pt reports good control at home - will re-order home meds this AM to include losartan - will need to continue  to monitor as OP and titrate meds if needed  3. Dyslipidemia - continue rosuvastatin & OP follow-up  Dipso pending CT   For questions or updates, please contact [facility] Please consult [email or web address] for contact info under Cardiology/STEMI.  Signed, Laurann Montana, PA-C 07/29/2021, 9:39 AM    History and all data above reviewed.  Patient examined.  I agree with the findings as above.  Some jaw discomfort.  No SOB.  The patient exam reveals COR:RRR  ,  Lungs: Clear  ,  Abd: Positive bowel sounds, no rebound no guarding, Ext No edema  .  All available labs, radiology testing, previous records reviewed. Agree with documented assessment and plan.   Chest pain:  Results of CT pending.  Disposition pending this.  HTN:  BP has  been mildly elevated.  No change in therapy.  She will keep a BP diary at home.    Fayrene Fearing [place] [employer]  11:02 AM  07/29/2021

## 2021-08-25 ENCOUNTER — Other Ambulatory Visit (HOSPITAL_COMMUNITY): Payer: Self-pay | Admitting: Gastroenterology

## 2021-08-25 ENCOUNTER — Other Ambulatory Visit: Payer: Self-pay | Admitting: Gastroenterology

## 2021-08-25 DIAGNOSIS — R1011 Right upper quadrant pain: Secondary | ICD-10-CM

## 2021-09-01 ENCOUNTER — Other Ambulatory Visit: Payer: Self-pay

## 2021-09-01 ENCOUNTER — Ambulatory Visit (HOSPITAL_COMMUNITY)
Admission: RE | Admit: 2021-09-01 | Discharge: 2021-09-01 | Disposition: A | Discharge status: Home / Self Care | Payer: 59 | Source: Ambulatory Visit | Attending: Gastroenterology | Admitting: Gastroenterology

## 2021-09-01 DIAGNOSIS — R1011 Right upper quadrant pain: Secondary | ICD-10-CM | POA: Insufficient documentation

## 2021-09-05 ENCOUNTER — Other Ambulatory Visit: Payer: Self-pay

## 2021-09-05 ENCOUNTER — Encounter (HOSPITAL_COMMUNITY)
Admission: RE | Admit: 2021-09-05 | Discharge: 2021-09-05 | Disposition: A | Discharge status: Home / Self Care | Payer: 59 | Source: Ambulatory Visit | Attending: Gastroenterology | Admitting: Gastroenterology

## 2021-09-05 DIAGNOSIS — R1011 Right upper quadrant pain: Secondary | ICD-10-CM | POA: Insufficient documentation

## 2021-09-05 MED ORDER — TECHNETIUM TC 99M MEBROFENIN IV KIT
5.2000 | PACK | Freq: Once | INTRAVENOUS | Status: AC
  Administered 2021-09-05: 5.2 via INTRAVENOUS

## 2021-09-12 ENCOUNTER — Encounter: Payer: Self-pay | Admitting: Medical

## 2021-09-12 ENCOUNTER — Ambulatory Visit: Payer: 59 | Admitting: Medical

## 2022-01-31 NOTE — Progress Notes (Deleted)
56 y.o. [id number] Married White or Caucasian Not Hispanic or Latino female here for annual exam.      Patient's last menstrual period was 10/31/2006.          Sexually active: {yes no:314532}  The current method of family planning is post menopausal status.    Exercising: {yes no:314532}  {types:19826} Smoker:  no  Health Maintenance: Pap: 09/30/20-WNL, HPV- neg; 03/03/16-WNL, HPV- neg History of abnormal Pap:  yes, in her 20s-repeat WNL MMG: 08/25/20-neg birads 1 BMD: n/a Colonoscopy: 2016-f/u in 53yrs TDaP: 09/23/13 Gardasil: n/a   reports that she has never smoked. She has never used smokeless tobacco. She reports current alcohol use. She reports that she does not use drugs.  Past Medical History:  Diagnosis Date   Abnormal Pap smear of cervix 12/25/1998   ASCUS   Anxiety    Back pain    Depression    Hyperlipidemia    Hypertension 12/26/2011   Nonobstructive atherosclerosis of coronary artery     Past Surgical History:  Procedure Laterality Date   ADENOIDECTOMY     CESAREAN SECTION     x 1    Current Outpatient Medications  Medication Sig Dispense Refill   ALPRAZolam (XANAX) 0.5 MG tablet Take 0.5 mg by mouth at bedtime as needed for anxiety.     aspirin EC 81 MG tablet Take 1 tablet (81 mg total) by mouth daily. Swallow whole.     doxycycline (VIBRAMYCIN) 50 MG capsule Take 50 mg by mouth daily as needed (roscea).  2   fluticasone (FLONASE) 50 MCG/ACT nasal spray Place 1 spray into both nostrils daily as needed for allergies.  1   losartan (COZAAR) 50 MG tablet Take 50 mg by mouth daily.     montelukast (SINGULAIR) 10 MG tablet Take 10 mg by mouth daily.     rosuvastatin (CRESTOR) 10 MG tablet Take 10 mg by mouth daily.     venlafaxine XR (EFFEXOR-XR) 37.5 MG 24 hr capsule Take 37.5 mg by mouth daily.     zolpidem (AMBIEN) 10 MG tablet TAKE 1 TABLET BY MOUTH AT BEDTIME AS NEEDED FOR SLEEP 30 tablet 3   No current facility-administered medications for this visit.     Family History  Problem Relation Age of Onset   Breast cancer Mother 5   Hyperlipidemia Mother    Hypertension Mother    Hyperlipidemia Father    Hypertension Father     Review of Systems  Exam:   LMP 10/31/2006   Weight change: @WEIGHTCHANGE @ Height:      Ht Readings from Last 3 Encounters:  09/29/20 5' 5.75" (1.67 m)  08/28/19 5' 5.5" (1.664 m)  05/29/17 5' 5.75" (1.67 m)    General appearance: alert, cooperative and appears stated age Head: Normocephalic, without obvious abnormality, atraumatic Neck: no adenopathy, supple, symmetrical, trachea midline and thyroid {CHL AMB PHY EX THYROID NORM DEFAULT:[phone]::"normal to inspection and palpation"} Lungs: clear to auscultation bilaterally Cardiovascular: regular rate and rhythm Breasts: {Exam; breast:13139::"normal appearance, no masses or tenderness"} Abdomen: soft, non-tender; non distended,  no masses,  no organomegaly Extremities: extremities normal, atraumatic, no cyanosis or edema Skin: Skin color, texture, turgor normal. No rashes or lesions Lymph nodes: Cervical, supraclavicular, and axillary nodes normal. No abnormal inguinal nodes palpated Neurologic: Grossly normal   Pelvic: External genitalia:  no lesions              Urethra:  normal appearing urethra with no masses, tenderness or lesions  Bartholins and Skenes: normal                 Vagina: normal appearing vagina with normal color and discharge, no lesions              Cervix: {CHL AMB PHY EX CERVIX NORM [phone]::"no lesions"}               Bimanual Exam:  Uterus:  {CHL AMB PHY EX UTERUS NORM [phone]::"normal size, contour, position, consistency, mobility, non-tender"}              Adnexa: {CHL AMB PHY EX ADNEXA NO MASS DEFAULT:[phone]::"no mass, fullness, tenderness"}               Rectovaginal: Confirms               Anus:  normal sphincter tone, no lesions  *** chaperoned for the exam.  A:  Well Woman with  normal exam  P:

## 2022-02-09 ENCOUNTER — Ambulatory Visit: Payer: 59 | Admitting: Obstetrics and Gynecology

## 2022-02-14 ENCOUNTER — Ambulatory Visit: Payer: Self-pay | Admitting: Obstetrics and Gynecology

## 2022-02-14 NOTE — Progress Notes (Unsigned)
56 y.o. G48P3003 Married White or Caucasian Not Hispanic or Latino female here for annual exam.      Patient's last menstrual period was 10/31/2006.          Sexually active: {yes no:314532}  The current method of family planning is {contraception:315051}.    Exercising: {yes no:314532}  {types:19826} Smoker:  {YES [id number]  Health Maintenance: Pap:  09/30/20 WNL Hr Hpv Neg,  03/03/3016 WNL NEG HPV History of abnormal Pap:  Yes, in her 20's the repeat was normal  MMG:  08/25/20 density B Bi-rads 1 neg  BMD:   none  Colonoscopy: 2016 f/u 10 years  TDaP:  09/23/13  Gardasil: n/a   reports that she has never smoked. She has never used smokeless tobacco. She reports current alcohol use. She reports that she does not use drugs.  Past Medical History:  Diagnosis Date   Abnormal Pap smear of cervix 12/25/1998   ASCUS   Anxiety    Back pain    Depression    Hyperlipidemia    Hypertension 12/26/2011   Nonobstructive atherosclerosis of coronary artery     Past Surgical History:  Procedure Laterality Date   ADENOIDECTOMY     CESAREAN SECTION     x 1    Current Outpatient Medications  Medication Sig Dispense Refill   ALPRAZolam (XANAX) 0.5 MG tablet Take 0.5 mg by mouth at bedtime as needed for anxiety.     aspirin EC 81 MG tablet Take 1 tablet (81 mg total) by mouth daily. Swallow whole.     doxycycline (VIBRAMYCIN) 50 MG capsule Take 50 mg by mouth daily as needed (roscea).  2   fluticasone (FLONASE) 50 MCG/ACT nasal spray Place 1 spray into both nostrils daily as needed for allergies.  1   losartan (COZAAR) 50 MG tablet Take 50 mg by mouth daily.     montelukast (SINGULAIR) 10 MG tablet Take 10 mg by mouth daily.     rosuvastatin (CRESTOR) 10 MG tablet Take 10 mg by mouth daily.     venlafaxine XR (EFFEXOR-XR) 37.5 MG 24 hr capsule Take 37.5 mg by mouth daily.     zolpidem (AMBIEN) 10 MG tablet TAKE 1 TABLET BY MOUTH AT BEDTIME AS NEEDED FOR SLEEP 30 tablet 3   No current  facility-administered medications for this visit.    Family History  Problem Relation Age of Onset   Breast cancer Mother 42   Hyperlipidemia Mother    Hypertension Mother    Hyperlipidemia Father    Hypertension Father     Review of Systems  Exam:   LMP 10/31/2006   Weight change: @WEIGHTCHANGE @ Height:      Ht Readings from Last 3 Encounters:  09/29/20 5' 5.75" (1.67 m)  08/28/19 5' 5.5" (1.664 m)  05/29/17 5' 5.75" (1.67 m)    General appearance: alert, cooperative and appears stated age Head: Normocephalic, without obvious abnormality, atraumatic Neck: no adenopathy, supple, symmetrical, trachea midline and thyroid {CHL AMB PHY EX THYROID NORM [phone]::"normal to inspection and palpation"} Lungs: clear to auscultation bilaterally Cardiovascular: regular rate and rhythm Breasts: {Exam; breast:13139::"normal appearance, no masses or tenderness"} Abdomen: soft, non-tender; non distended,  no masses,  no organomegaly Extremities: extremities normal, atraumatic, no cyanosis or edema Skin: Skin color, texture, turgor normal. No rashes or lesions Lymph nodes: Cervical, supraclavicular, and axillary nodes normal. No abnormal inguinal nodes palpated Neurologic: Grossly normal   Pelvic: External genitalia:  no lesions  Urethra:  normal appearing urethra with no masses, tenderness or lesions              Bartholins and Skenes: normal                 Vagina: normal appearing vagina with normal color and discharge, no lesions              Cervix: {CHL AMB PHY EX CERVIX NORM DEFAULT:[phone]::"no lesions"}               Bimanual Exam:  Uterus:  {CHL AMB PHY EX UTERUS NORM DEFAULT:[phone]::"normal size, contour, position, consistency, mobility, non-tender"}              Adnexa: {CHL AMB PHY EX ADNEXA NO MASS DEFAULT:[phone]::"no mass, fullness, tenderness"}               Rectovaginal: Confirms               Anus:  normal sphincter tone, no  lesions  *** chaperoned for the exam.  A:  Well Woman with normal exam  P:

## 2022-02-21 ENCOUNTER — Ambulatory Visit: Payer: Self-pay | Admitting: Obstetrics and Gynecology

## 2022-02-21 ENCOUNTER — Other Ambulatory Visit: Payer: Self-pay

## 2022-08-01 ENCOUNTER — Other Ambulatory Visit: Payer: Self-pay | Admitting: Internal Medicine

## 2022-08-01 DIAGNOSIS — Z1231 Encounter for screening mammogram for malignant neoplasm of breast: Secondary | ICD-10-CM

## 2022-08-15 ENCOUNTER — Ambulatory Visit
Admission: RE | Admit: 2022-08-15 | Discharge: 2022-08-15 | Disposition: A | Discharge status: Home / Self Care | Payer: No Typology Code available for payment source | Source: Ambulatory Visit | Attending: Internal Medicine | Admitting: Internal Medicine

## 2022-08-15 DIAGNOSIS — Z1231 Encounter for screening mammogram for malignant neoplasm of breast: Secondary | ICD-10-CM

## 2022-08-21 NOTE — Progress Notes (Deleted)
56 y.o. G69P3003 Married White or Caucasian Not Hispanic or Latino female here for annual exam.      Patient's last menstrual period was 10/31/2006.          Sexually active: {yes no:314532}  The current method of family planning is {contraception:315051}.    Exercising: {yes no:314532}  {types:19826} Smoker:  {YES [id number]  Health Maintenance: Pap:  09/30/20 WNL Hr HPV Neg, 03/03/3016 WNL NEG HPV, 01/10/13 WNL neg HR HPV History of abnormal Pap:  Yes, in her 20's the repeat was normal  MMG:  08/17/2022 density B Bi-rads 1 neg  BMD:n/a    Colonoscopy: 2016 repeat in 10 years  TDaP:  09/23/13  Gardasil: n/a   reports that she has never smoked. She has never used smokeless tobacco. She reports current alcohol use. She reports that she does not use drugs.  Past Medical History:  Diagnosis Date   Abnormal Pap smear of cervix 12/25/1998   ASCUS   Anxiety    Back pain    Depression    Hyperlipidemia    Hypertension 12/26/2011   Nonobstructive atherosclerosis of coronary artery     Past Surgical History:  Procedure Laterality Date   ADENOIDECTOMY     CESAREAN SECTION     x 1    Current Outpatient Medications  Medication Sig Dispense Refill   ALPRAZolam (XANAX) 0.5 MG tablet Take 0.5 mg by mouth at bedtime as needed for anxiety.     aspirin EC 81 MG tablet Take 1 tablet (81 mg total) by mouth daily. Swallow whole.     doxycycline (VIBRAMYCIN) 50 MG capsule Take 50 mg by mouth daily as needed (roscea).  2   fluticasone (FLONASE) 50 MCG/ACT nasal spray Place 1 spray into both nostrils daily as needed for allergies.  1   losartan (COZAAR) 50 MG tablet Take 50 mg by mouth daily.     montelukast (SINGULAIR) 10 MG tablet Take 10 mg by mouth daily.     rosuvastatin (CRESTOR) 10 MG tablet Take 10 mg by mouth daily.     venlafaxine XR (EFFEXOR-XR) 37.5 MG 24 hr capsule Take 37.5 mg by mouth daily.     zolpidem (AMBIEN) 10 MG tablet TAKE 1 TABLET BY MOUTH AT BEDTIME AS NEEDED FOR SLEEP 30  tablet 3   No current facility-administered medications for this visit.    Family History  Problem Relation Age of Onset   Breast cancer Mother 80   Hyperlipidemia Mother    Hypertension Mother    Hyperlipidemia Father    Hypertension Father     Review of Systems  Exam:   LMP 10/31/2006   Weight change: @WEIGHTCHANGE @ Height:      Ht Readings from Last 3 Encounters:  09/29/20 5' 5.75" (1.67 m)  08/28/19 5' 5.5" (1.664 m)  05/29/17 5' 5.75" (1.67 m)    General appearance: alert, cooperative and appears stated age Head: Normocephalic, without obvious abnormality, atraumatic Neck: no adenopathy, supple, symmetrical, trachea midline and thyroid {CHL AMB PHY EX THYROID NORM DEFAULT:[phone]::"normal to inspection and palpation"} Lungs: clear to auscultation bilaterally Cardiovascular: regular rate and rhythm Breasts: {Exam; breast:13139::"normal appearance, no masses or tenderness"} Abdomen: soft, non-tender; non distended,  no masses,  no organomegaly Extremities: extremities normal, atraumatic, no cyanosis or edema Skin: Skin color, texture, turgor normal. No rashes or lesions Lymph nodes: Cervical, supraclavicular, and axillary nodes normal. No abnormal inguinal nodes palpated Neurologic: Grossly normal   Pelvic: External genitalia:  no lesions  Urethra:  normal appearing urethra with no masses, tenderness or lesions              Bartholins and Skenes: normal                 Vagina: normal appearing vagina with normal color and discharge, no lesions              Cervix: {CHL AMB PHY EX CERVIX NORM DEFAULT:[phone]::"no lesions"}               Bimanual Exam:  Uterus:  {CHL AMB PHY EX UTERUS NORM DEFAULT:[phone]::"normal size, contour, position, consistency, mobility, non-tender"}              Adnexa: {CHL AMB PHY EX ADNEXA NO MASS [phone]::"no mass, fullness, tenderness"}               Rectovaginal: Confirms               Anus:  normal  sphincter tone, no lesions  *** chaperoned for the exam.  A:  Well Woman with normal exam  P:

## 2022-08-31 ENCOUNTER — Ambulatory Visit: Payer: Self-pay | Admitting: Obstetrics and Gynecology

## 2022-09-18 NOTE — Progress Notes (Deleted)
56 y.o. G42P3003 Married Caucasian female here for annual exam.    PCP:  Shon Baton, MD  Patient's last menstrual period was 10/31/2006.           Sexually active: {yes no:314532}  The current method of family planning is post menopausal status.    Exercising: {yes no:314532}  {types:19826} Smoker:  no  Health Maintenance: Pap:  09-30-20 Neg:Neg HR HPV, 03-03-16 Neg:Neg HR HPV, 01-10-13 Neg:Neg HR HPV History of abnormal Pap:  yes, in her 20's repeat pap normal. MMG:  08-15-22 Neg/BiRads1 Colonoscopy:  2016 repeat in 10 years  BMD:   n/a  Result  n/a TDaP:   09/23/13  Gardasil:   no HIV: 07-28-21 NR Hep C: *** Screening Labs:  Hb today: ***, Urine today: ***   reports that she has never smoked. She has never used smokeless tobacco. She reports current alcohol use. She reports that she does not use drugs.  Past Medical History:  Diagnosis Date   Abnormal Pap smear of cervix 12/25/1998   ASCUS   Anxiety    Back pain    Depression    Hyperlipidemia    Hypertension 12/26/2011   Nonobstructive atherosclerosis of coronary artery     Past Surgical History:  Procedure Laterality Date   ADENOIDECTOMY     CESAREAN SECTION     x 1    Current Outpatient Medications  Medication Sig Dispense Refill   ALPRAZolam (XANAX) 0.5 MG tablet Take 0.5 mg by mouth at bedtime as needed for anxiety.     aspirin EC 81 MG tablet Take 1 tablet (81 mg total) by mouth daily. Swallow whole.     doxycycline (VIBRAMYCIN) 50 MG capsule Take 50 mg by mouth daily as needed (roscea).  2   fluticasone (FLONASE) 50 MCG/ACT nasal spray Place 1 spray into both nostrils daily as needed for allergies.  1   losartan (COZAAR) 50 MG tablet Take 50 mg by mouth daily.     montelukast (SINGULAIR) 10 MG tablet Take 10 mg by mouth daily.     rosuvastatin (CRESTOR) 10 MG tablet Take 10 mg by mouth daily.     venlafaxine XR (EFFEXOR-XR) 37.5 MG 24 hr capsule Take 37.5 mg by mouth daily.     zolpidem (AMBIEN) 10 MG tablet  TAKE 1 TABLET BY MOUTH AT BEDTIME AS NEEDED FOR SLEEP 30 tablet 3   No current facility-administered medications for this visit.    Family History  Problem Relation Age of Onset   Breast cancer Mother 86   Hyperlipidemia Mother    Hypertension Mother    Hyperlipidemia Father    Hypertension Father     Review of Systems  Exam:   LMP 10/31/2006     General appearance: alert, cooperative and appears stated age Head: normocephalic, without obvious abnormality, atraumatic Neck: no adenopathy, supple, symmetrical, trachea midline and thyroid normal to inspection and palpation Lungs: clear to auscultation bilaterally Breasts: normal appearance, no masses or tenderness, No nipple retraction or dimpling, No nipple discharge or bleeding, No axillary adenopathy Heart: regular rate and rhythm Abdomen: soft, non-tender; no masses, no organomegaly Extremities: extremities normal, atraumatic, no cyanosis or edema Skin: skin color, texture, turgor normal. No rashes or lesions Lymph nodes: cervical, supraclavicular, and axillary nodes normal. Neurologic: grossly normal  Pelvic: External genitalia:  no lesions              No abnormal inguinal nodes palpated.  Urethra:  normal appearing urethra with no masses, tenderness or lesions              Bartholins and Skenes: normal                 Vagina: normal appearing vagina with normal color and discharge, no lesions              Cervix: no lesions              Pap taken: {yes no:314532} Bimanual Exam:  Uterus:  normal size, contour, position, consistency, mobility, non-tender              Adnexa: no mass, fullness, tenderness              Rectal exam: {yes no:314532}.  Confirms.              Anus:  normal sphincter tone, no lesions  Chaperone was present for exam:  ***  Assessment:   Well woman visit with gynecologic exam.   Plan: Mammogram screening discussed. Self breast awareness reviewed. Pap and HR HPV as  above. Guidelines for Calcium, Vitamin D, regular exercise program including cardiovascular and weight bearing exercise.   Follow up annually and prn.   Additional counseling given.  {yes [id number]. _______ minutes face to face time of which over 50% was spent in counseling.    After visit summary provided.

## 2022-09-19 ENCOUNTER — Ambulatory Visit: Payer: Self-pay | Admitting: Obstetrics and Gynecology

## 2022-09-26 ENCOUNTER — Ambulatory Visit (INDEPENDENT_AMBULATORY_CARE_PROVIDER_SITE_OTHER): Payer: No Typology Code available for payment source | Admitting: Obstetrics and Gynecology

## 2022-09-26 ENCOUNTER — Encounter: Payer: Self-pay | Admitting: Obstetrics and Gynecology

## 2022-09-26 VITALS — BP 114/70 | HR 99 | Ht 65.25 in | Wt 181.0 lb

## 2022-09-26 DIAGNOSIS — Z01419 Encounter for gynecological examination (general) (routine) without abnormal findings: Secondary | ICD-10-CM | POA: Diagnosis not present

## 2022-09-26 DIAGNOSIS — N952 Postmenopausal atrophic vaginitis: Secondary | ICD-10-CM | POA: Diagnosis not present

## 2022-09-26 MED ORDER — ESTRADIOL 0.1 MG/GM VA CREA: TOPICAL_CREAM | VAGINAL | 2 refills | Status: AC

## 2022-09-26 NOTE — Patient Instructions (Signed)

## 2022-09-26 NOTE — Progress Notes (Signed)
56 y.o. G70P3003 Married Caucasian female here for annual exam.    Referred to me for care through another patient of mine.  States she is loosing her hair but still has a lot of hair.  Saw her PCP recently. Normal thyroid function.  Under a lot of stress.    Has UTIs:  3 - 4 since 2018.  Not related to intercourse.  Lost her sense of smell after having Covid.   2 children at college.   1 child working at [employer].   PCP:   Creola Corn, MD  Patient's last menstrual period was 10/31/2006.           Sexually active: Yes.    Not active often.  The current method of family planning is post menopausal status.    Exercising: Yes.     walking Smoker:  no  Health Maintenance: Pap:  09-30-20 neg HPV HR neg, 03-03-16 neg HPV HR neg History of abnormal Pap:  yes in her 20's MMG:  08-15-22 category b density birads 1:neg Colonoscopy:  2016 f/u 89yrs BMD:   none  Result  n/a TDaP:  2014 Gardasil:   no HIV: neg 2022 Hep C: unsure Screening Labs:  PCP Flu vaccine:  PCP Covid booster:  discussed.    reports that she has never smoked. She has never used smokeless tobacco. She reports current alcohol use. She reports that she does not use drugs.  Past Medical History:  Diagnosis Date   Abnormal Pap smear of cervix 12/25/1998   ASCUS   Anxiety    Back pain    Depression    Hyperlipidemia    Hypertension 12/26/2011   Nonobstructive atherosclerosis of coronary artery     Past Surgical History:  Procedure Laterality Date   ADENOIDECTOMY     CESAREAN SECTION     x 1    Current Outpatient Medications  Medication Sig Dispense Refill   ALPRAZolam (XANAX) 0.5 MG tablet Take 0.5 mg by mouth at bedtime as needed for anxiety.     doxycycline (VIBRAMYCIN) 50 MG capsule Take 50 mg by mouth daily as needed (roscea).  2   fluticasone (FLONASE) 50 MCG/ACT nasal spray Place 1 spray into both nostrils daily as needed for allergies.  1   losartan (COZAAR) 50 MG tablet Take 50 mg by mouth  daily.     montelukast (SINGULAIR) 10 MG tablet Take 10 mg by mouth daily.     rosuvastatin (CRESTOR) 20 MG tablet TAKE ONE TABLET BY MOUTH DAILY-this is correct Rx dosage per Dr. Timothy Lasso Orally Once a day for 90 days     venlafaxine XR (EFFEXOR-XR) 37.5 MG 24 hr capsule Take 37.5 mg by mouth daily.     zolpidem (AMBIEN) 10 MG tablet TAKE 1 TABLET BY MOUTH AT BEDTIME AS NEEDED FOR SLEEP 30 tablet 3   No current facility-administered medications for this visit.    Family History  Problem Relation Age of Onset   Breast cancer Mother 23   Hyperlipidemia Mother    Hypertension Mother    Hyperlipidemia Father    Hypertension Father     Review of Systems  Constitutional: Negative.   HENT: Negative.    Eyes: Negative.   Respiratory: Negative.    Cardiovascular: Negative.   Gastrointestinal: Negative.   Endocrine: Negative.   Genitourinary: Negative.   Musculoskeletal: Negative.   Skin: Negative.   Allergic/Immunologic: Negative.   Neurological: Negative.   Hematological: Negative.   Psychiatric/Behavioral: Negative.  Exam:   BP 114/70   Pulse 99   Ht 5' 5.25" (1.657 m)   Wt 181 lb (82.1 kg)   LMP 10/31/2006   SpO2 97%   BMI 29.89 kg/m     General appearance: alert, cooperative and appears stated age Head: normocephalic, without obvious abnormality, atraumatic Neck: no adenopathy, supple, symmetrical, trachea midline and thyroid normal to inspection and palpation Lungs: clear to auscultation bilaterally Breasts: normal appearance, no masses or tenderness, No nipple retraction or dimpling, No nipple discharge or bleeding, No axillary adenopathy Heart: regular rate and rhythm Abdomen: soft, non-tender; no masses, no organomegaly Extremities: extremities normal, atraumatic, no cyanosis or edema Skin: skin color, texture, turgor normal. No rashes or lesions Lymph nodes: cervical, supraclavicular, and axillary nodes normal. Neurologic: grossly normal  Pelvic: External  genitalia:  no lesions              No abnormal inguinal nodes palpated.              Urethra:  normal appearing urethra with no masses, tenderness or lesions              Bartholins and Skenes: normal                 Vagina: normal appearing vagina with normal color and discharge, no lesions.  Vaginal atrophy noted.              Cervix: no lesions              Pap taken: no Bimanual Exam:  Uterus:  normal size, contour, position, consistency, mobility, non-tender              Adnexa: no mass, fullness, tenderness              Rectal exam: yes.  Confirms.              Anus:  normal sphincter tone, no lesions  Chaperone was present for exam:  yes.  Assessment:   Well woman visit with gynecologic exam. FH breast cancer in patient's mother. Hx UTIs.  Vaginal atrophy. Remote history of abnormal pap. Hair loss.  Increased coronary calcium score in chart review.  CT coronary fraction flow showing no evidence of functional stenosis.   Plan: Mammogram screening discussed. Self breast awareness reviewed. Pap and HR HPV 2026. Guidelines for Calcium, Vitamin D, regular exercise program including cardiovascular and weight bearing exercise. Vaginal estrogen.  We discussed benefits of treating vaginal atrophy and reducing risk of urinary tract infections in postmenopausal women.  Instructed in use.  I discussed potential effect on breast cancer.  We discussed stress as a cause for hair loss. Follow up annually and prn.    After visit summary provided.

## 2023-02-08 IMAGING — DX DG CHEST 2V
2 series · 2 of 2 positions shown · non-contrast
Comparison: 04/11/2012

CLINICAL DATA: Chest pain

EXAM:
CHEST - 2 VIEW

[w chest pa]
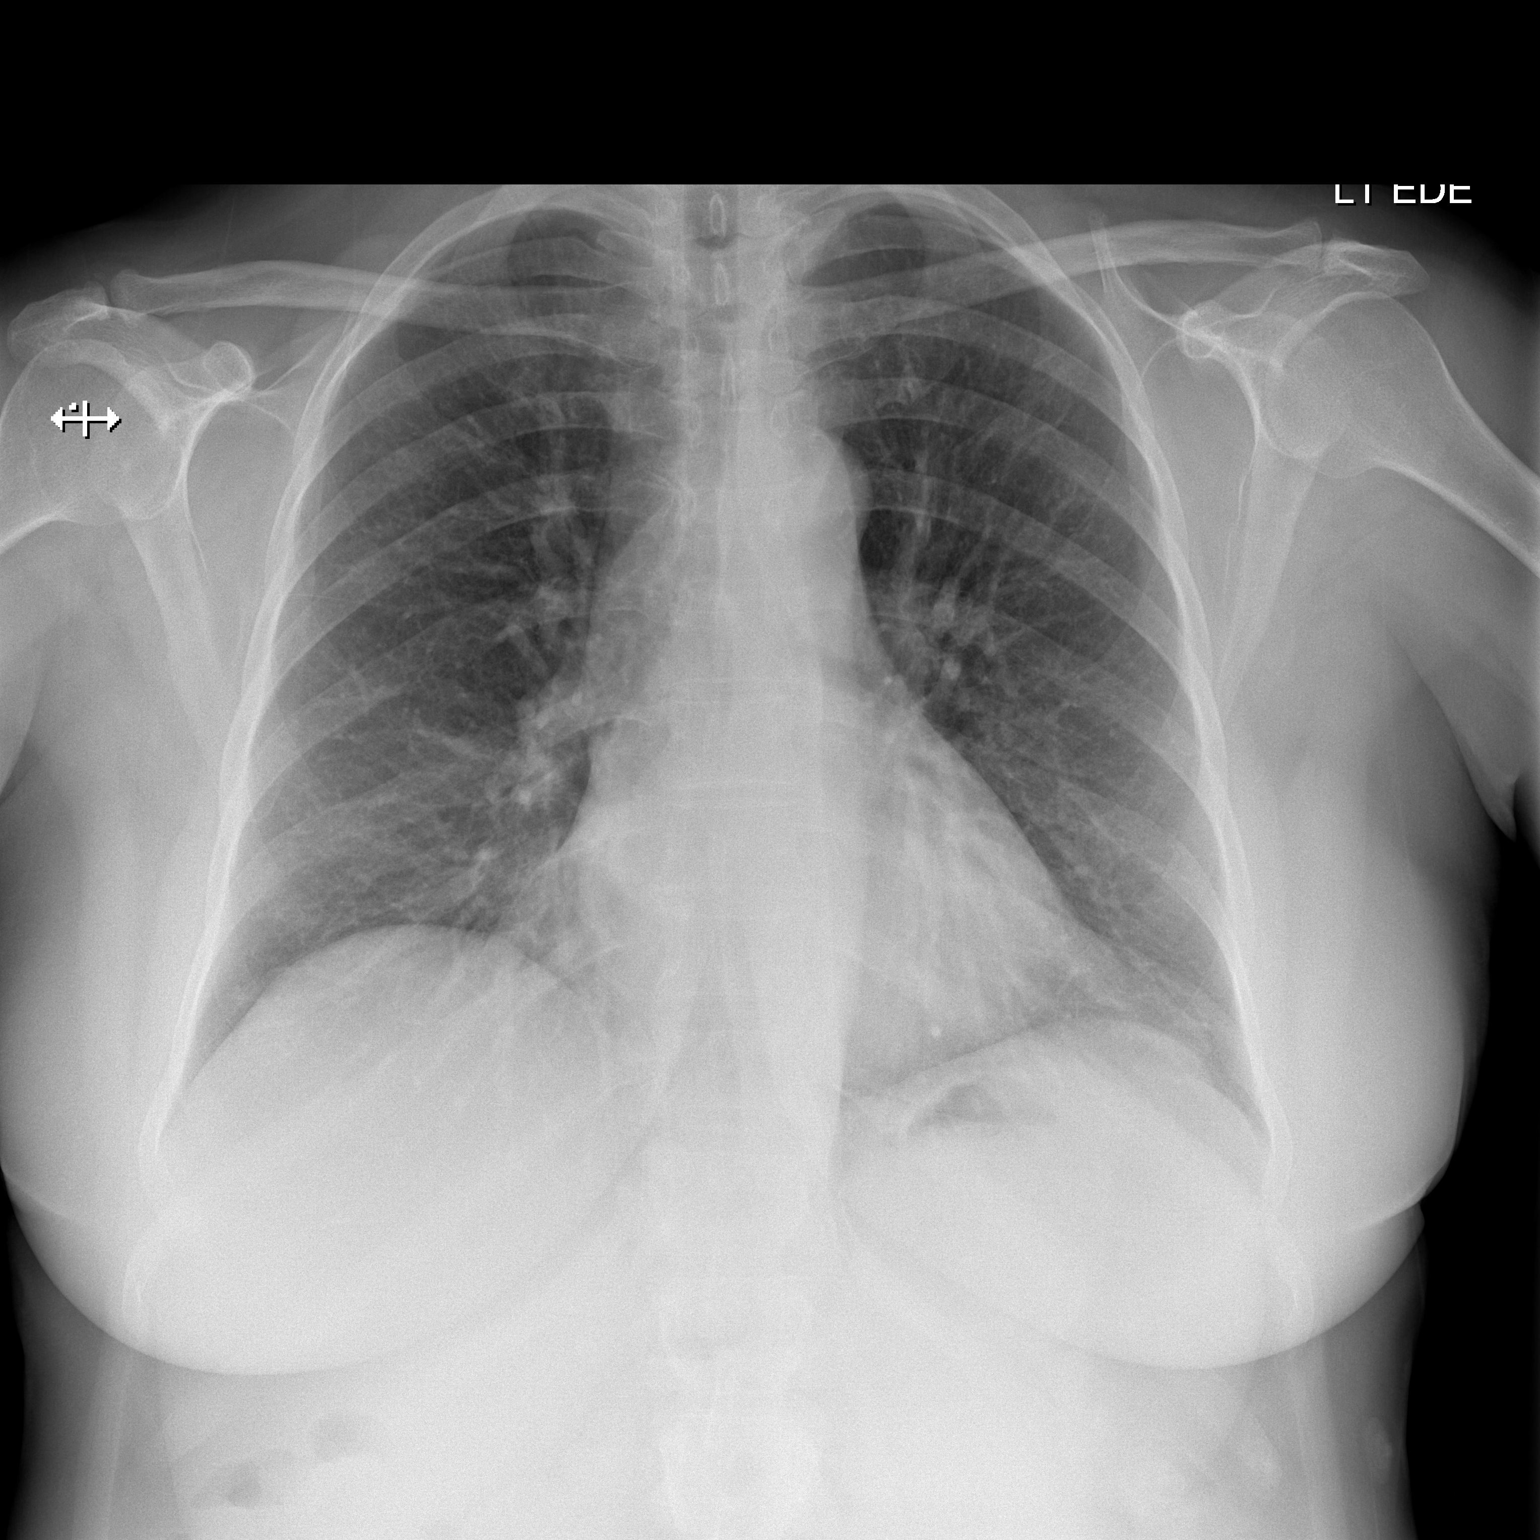

[w chest decub]
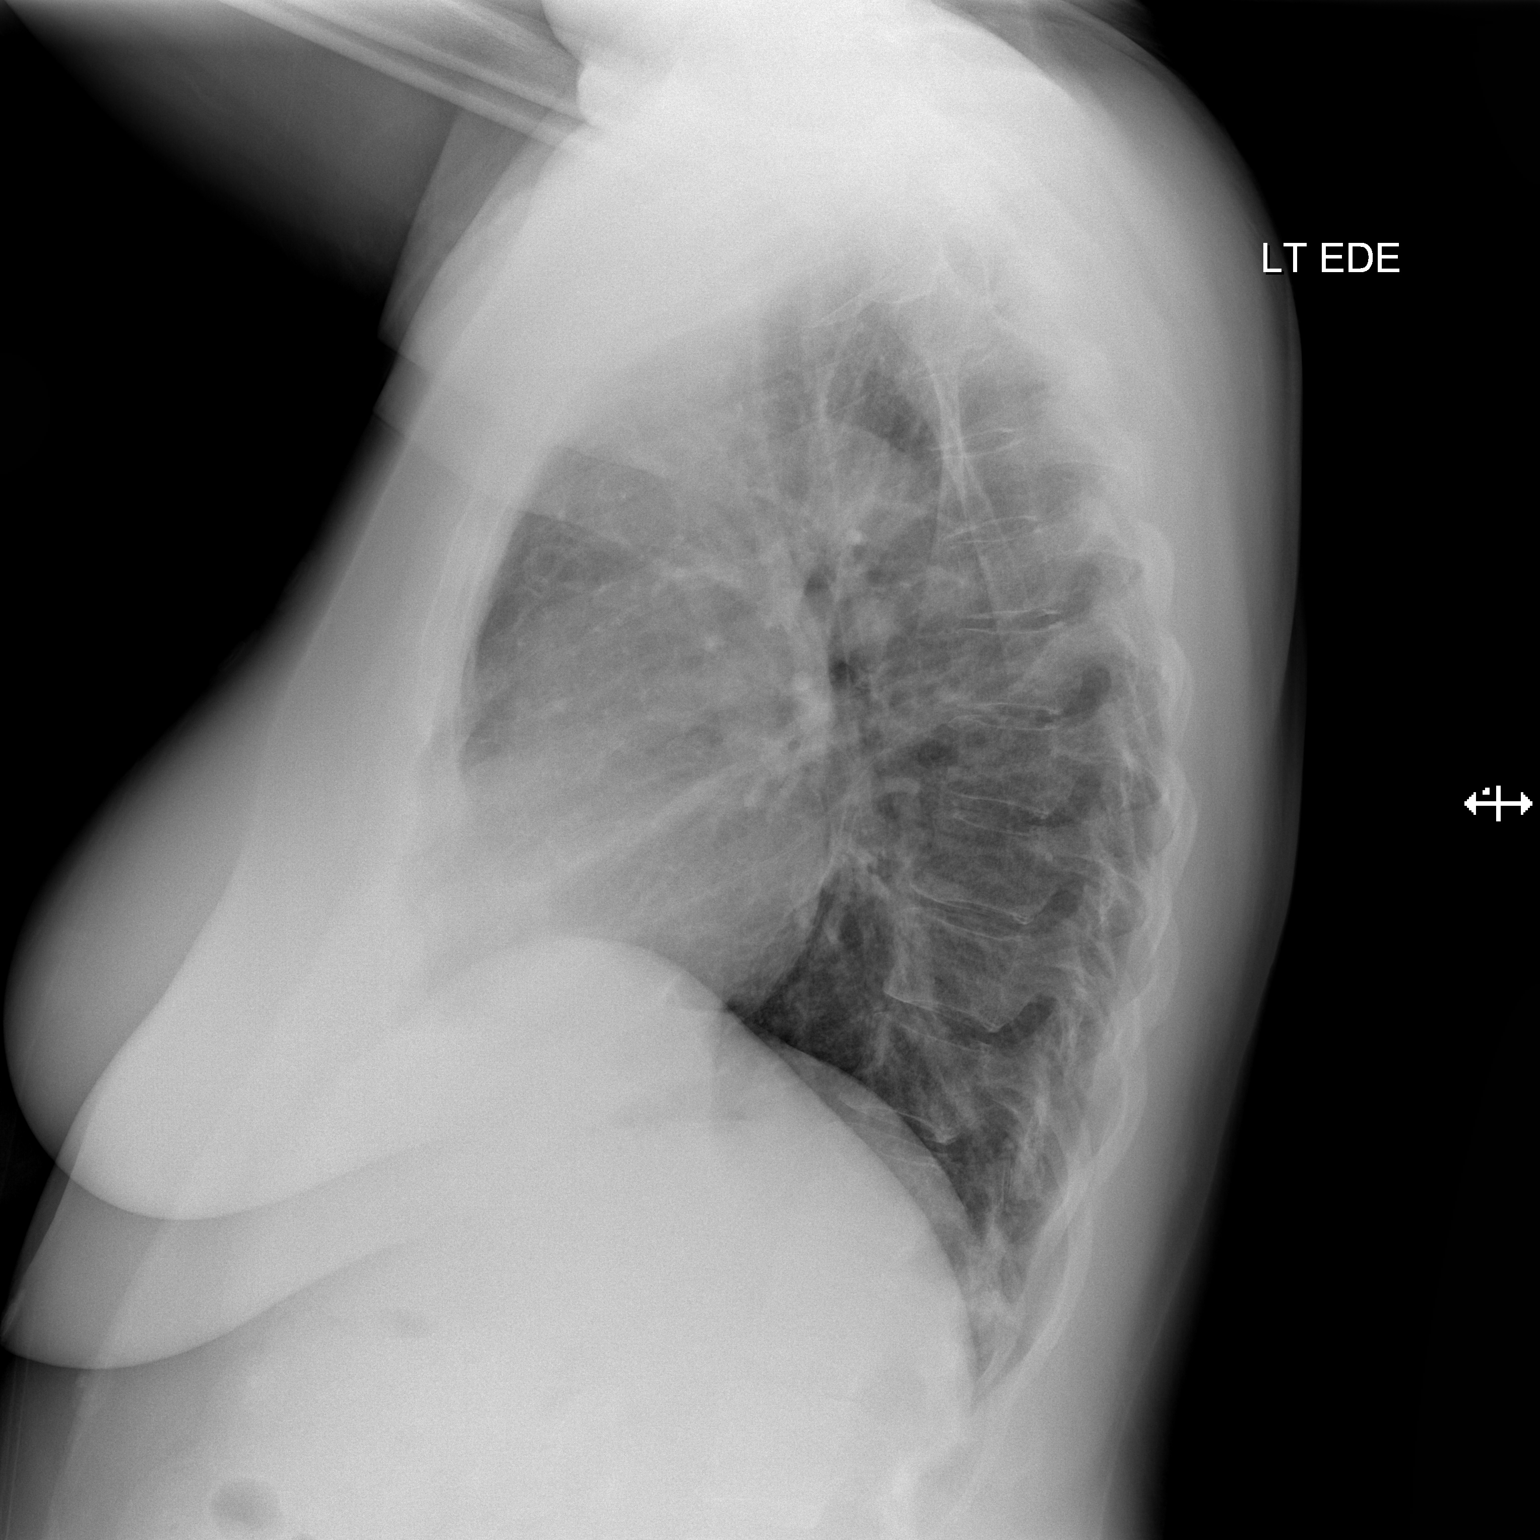

[2 of 2 positions shown; findings below may reference images not displayed]

FINDINGS: The heart size and mediastinal contours are within normal limits.
Both lungs are clear. The visualized skeletal structures are
unremarkable.
IMPRESSION: No acute abnormality of the lungs.

## 2023-02-09 IMAGING — CT CT HEART MORP W/ CTA COR W/ SCORE W/ CA W/CM &/OR W/O CM
4 of 7 series · 8 of 20 positions shown, 9 images · non-contrast
Comparison: Chest pain, nonspecific chest pain in a 55-year-old
female.
COMPARISON: Chest pain, nonspecific chest pain in a 55-year-old
female.

Addendum:
EXAM:
OVER-READ INTERPRETATION  CT CHEST

The following report is an over-read performed by radiologist Dr.
over-read does not include interpretation of cardiac or coronary
anatomy or pathology. The coronary calcium score/coronary CTA
interpretation by the cardiologist is attached. Imaging targeted to
cardiac structures with imaging of the mid chest on today's study.
CLINICAL DATA: 55 Year-old White Female
Cardiac/Coronary  CTA
TECHNIQUE: The patient was scanned on a [employer] Force scanner.

[Series 6: best diast 73 % · axial · 0.39mm/px · z∈[+55,+99]mm · 2 of 331 slices shown]
[im 111/331  vessel]
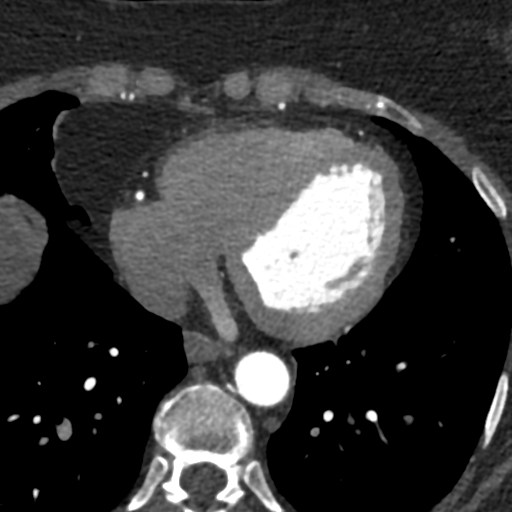
[im 221/331  vessel]
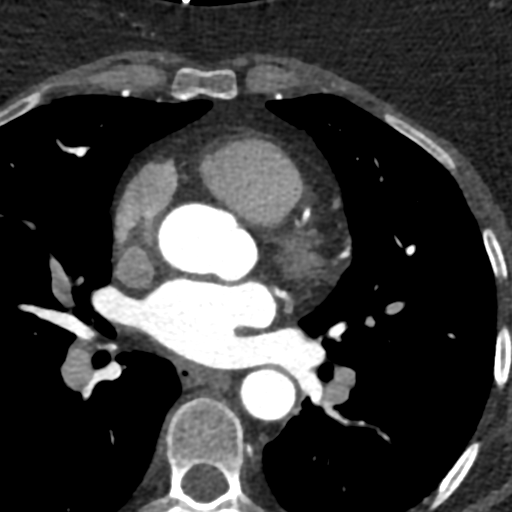

[Series 7: ts diast sharp 73 % · axial · 0.39mm/px · z∈[+55,+99]mm · 2 of 331 slices shown]
[im 111/331  lung]
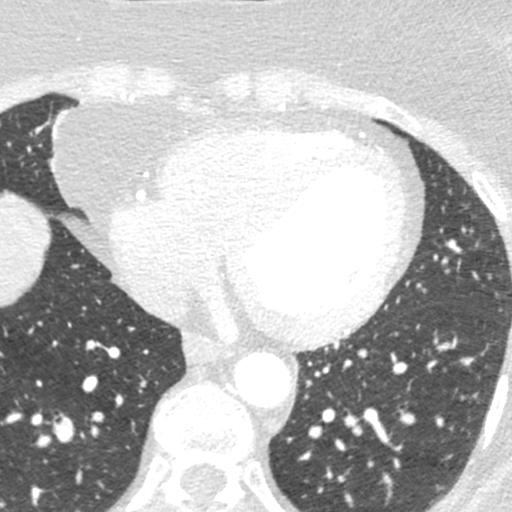
[im 221/331  lung]
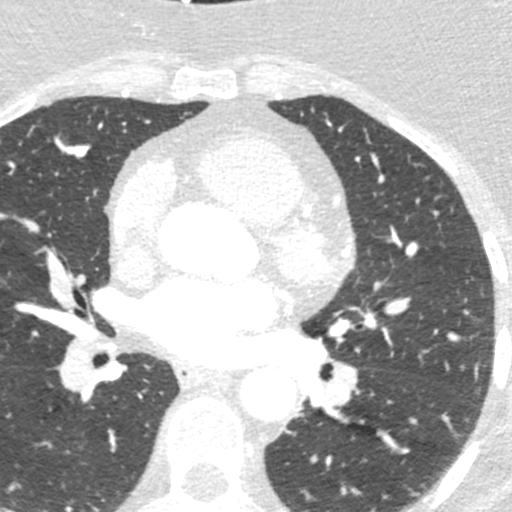

[Series 10: best syst · axial · 0.39mm/px · z∈[+55,+99]mm · 2 of 331 slices shown, 3 images]
[im 111/331  vessel]
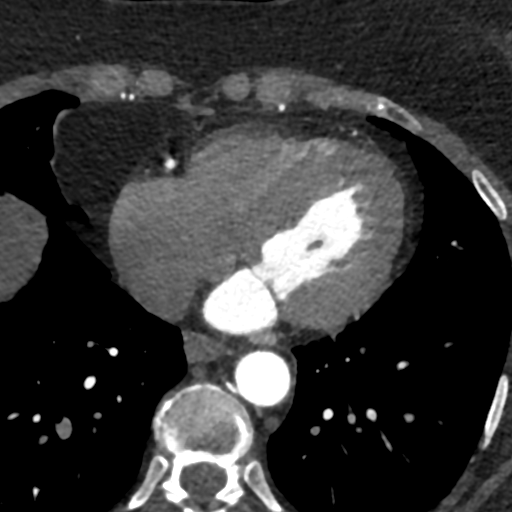
[im 111/331  lung]
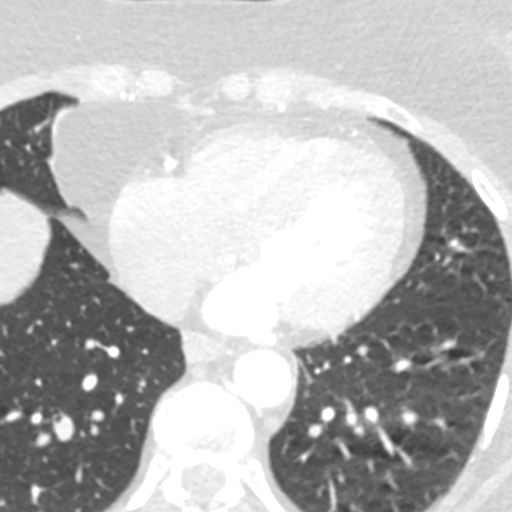
[im 221/331  vessel]
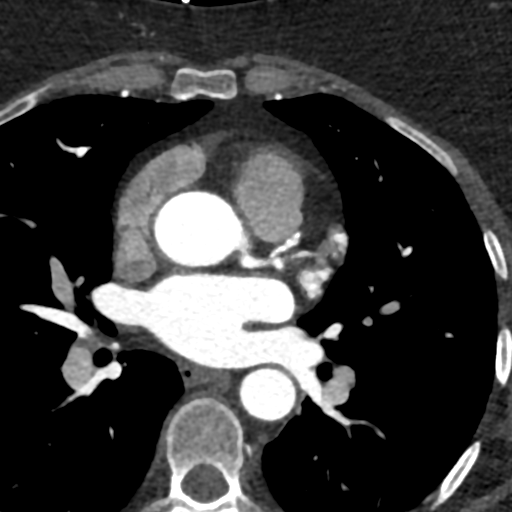

[Series 11: ts syst sharp · axial · 0.39mm/px · z∈[+55,+99]mm · 2 of 331 slices shown]
[im 111/331  lung]
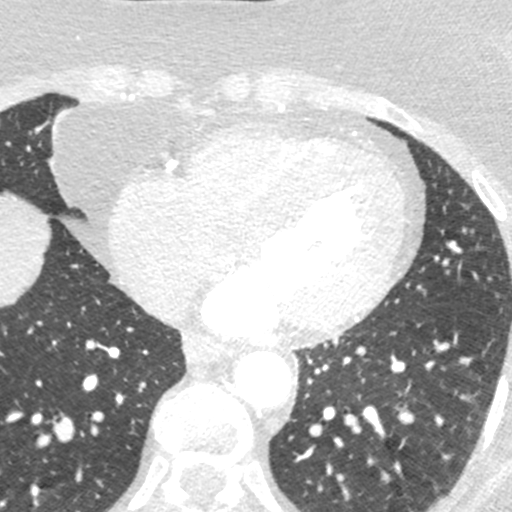
[im 221/331  lung]
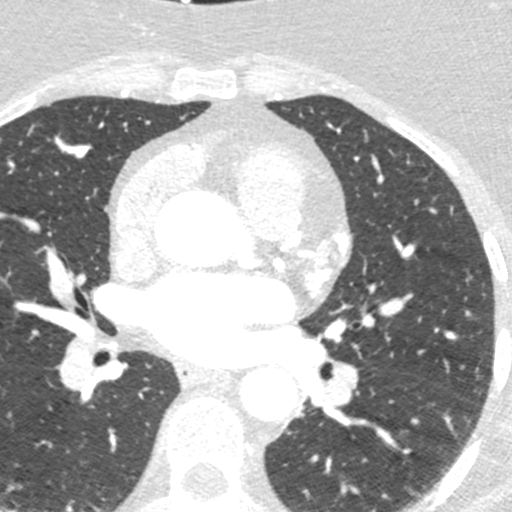

[8 of 20 positions shown; findings below may reference images not displayed]

FINDINGS: Vascular: See dedicated report for cardiovascular details.

Mediastinum/Nodes: Visualized mediastinum without signs of
adenopathy or acute process on limited assessment.

Lungs/Pleura: No consolidation. No pleural effusion. Visualized
airways are patent.

Upper Abdomen: Hepatic steatosis. No acute upper abdominal process
on very limited assessment.

Musculoskeletal: No acute musculoskeletal finding. Spinal
degenerative changes.
IMPRESSION: No acute findings in imaged portions of the chest, see dedicated
report for cardiovascular details provided separately.

Hepatic steatosis.
FINDINGS: A 100 kV prospective scan was triggered in the descending thoracic
aorta at 111 HU's. Axial non-contrast 3 mm slices were carried out
through the heart. The data set was analyzed on a dedicated work
station and scored using the Agatson method. Gantry rotation speed
was 250 msecs and collimation was .6 mm. No beta blockade and 0.8 mg
of sl NTG was given. The 3D data set was reconstructed in 5%
intervals of the 67-82 % of the R-R cycle. Diastolic phases were
analyzed on a dedicated work station using MPR, MIP and VRT modes.
The patient received 100 cc of contrast.

Aorta:  Normal size.  No calcifications.  No dissection.

Main Pulmonary Artery: Normal size of the pulmonary artery.

Aortic Valve:  Tri-leaflet.  No calcifications.

Coronary Arteries:  Normal coronary origin.  Right dominance.

Coronary Calcium Score:

Left main: 0

Left anterior descending artery: 199

Left circumflex artery: 8

Right coronary artery: 25

Total: 232

Percentile: 98th for age, sex, and race matched control.

RCA is a large dominant artery that gives rise to PDA and PLA. There
are luminal irregularities.

Left main is a large artery that gives rise to LAD and LCX arteries.
There is no significant plaque.

LAD is a large vessel that gives rise to two diagonal vessels There
is a mild non-obstructive (40%) calcified plaque in the proximal
LAD.

LCX is a non-dominant artery.  There are luminal irregularities.

There is a ramus intermedius vessel that bifurcates There is a mild
non-obstructive calcified plaque that the bifurcation.

Other findings:

Normal variant pulmonary vein drainage into the left atrium- left
common pulmonary vein.

Normal left atrial appendage without a thrombus.

Extra-cardiac findings: See attached radiology report for
non-cardiac structures.

Slab artifact noted.
IMPRESSION: 1. Coronary calcium score of 232. This was 98th percentile for age,
sex, and race matched control.

2. Normal coronary origin with right dominance.

3. CAD-RADS 2. Mild non-obstructive CAD (25-49%). CT-FFR sent for
lesion greater than 40%.

4. Compared to 09/19/2019 calcium score study, calcium score has
slightly increased.

RECOMMENDATIONS:



If CAC = 0, it is reasonable to withhold statin therapy and reassess
in 5 to 10 years, as long as higher risk conditions are absent
(diabetes mellitus, family history of premature CHD in first degree
relatives (males <55 years; females <65 years), cigarette smoking,
LDL >=190 mg/dL or other independent risk factors).

If CAC is 1 to 99, it is reasonable to initiate statin therapy for
patients >=55 years of age.

If CAC is >=100 or >=75th percentile, it is reasonable to initiate
statin therapy at any age.

Cardiology referral should be considered for patients with CAC
scores =400 or >=75th percentile.

*8348 AHA/ACC/AACVPR/AAPA/ABC/PETRUS KAPPAH/RAMIEZ/DEEQA RAYAAN/Bui Xuan/CHWCHWCHW/SVEINBERG/CHOY
Guideline on the Management of Blood Cholesterol: A Report of the
American College of Cardiology/American Heart Association Task Force
on Clinical Practice Guidelines. J Am Coll Cardiol.
9305;73(24):2348-2425.

*** End of Addendum ***
EXAM:
OVER-READ INTERPRETATION  CT CHEST

The following report is an over-read performed by radiologist Dr.
over-read does not include interpretation of cardiac or coronary
anatomy or pathology. The coronary calcium score/coronary CTA
interpretation by the cardiologist is attached. Imaging targeted to
cardiac structures with imaging of the mid chest on today's study.
FINDINGS: Vascular: See dedicated report for cardiovascular details.

Mediastinum/Nodes: Visualized mediastinum without signs of
adenopathy or acute process on limited assessment.

Lungs/Pleura: No consolidation. No pleural effusion. Visualized
airways are patent.

Upper Abdomen: Hepatic steatosis. No acute upper abdominal process
on very limited assessment.

Musculoskeletal: No acute musculoskeletal finding. Spinal
degenerative changes.
IMPRESSION: No acute findings in imaged portions of the chest, see dedicated
report for cardiovascular details provided separately.

Hepatic steatosis.

## 2023-03-15 IMAGING — US US ABDOMEN LIMITED
1 series · 15 of 25 positions shown · non-contrast
Comparison: None.

CLINICAL DATA: Right upper quadrant

EXAM:
ULTRASOUND ABDOMEN LIMITED RIGHT UPPER QUADRANT

[Series 1: us abdomen limited ruq mc & wl · 15 of 49 slices shown]
[im 1/49]
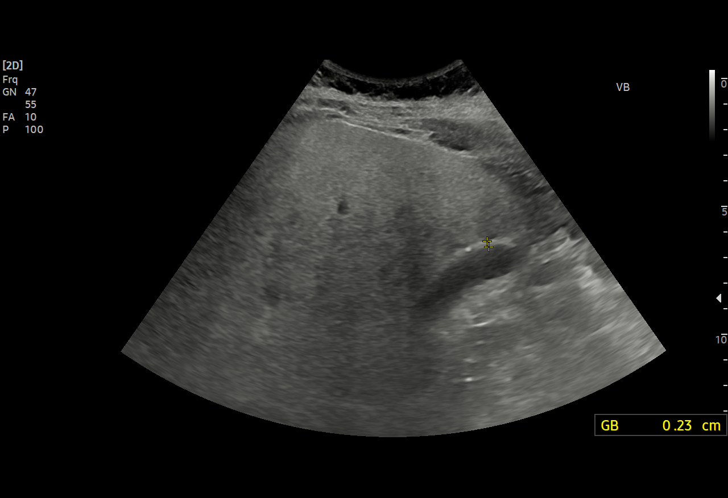
[im 5/49]
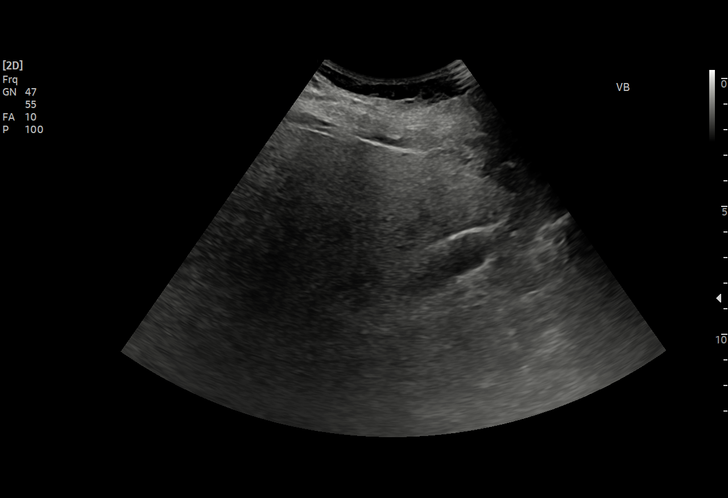
[im 9/49]
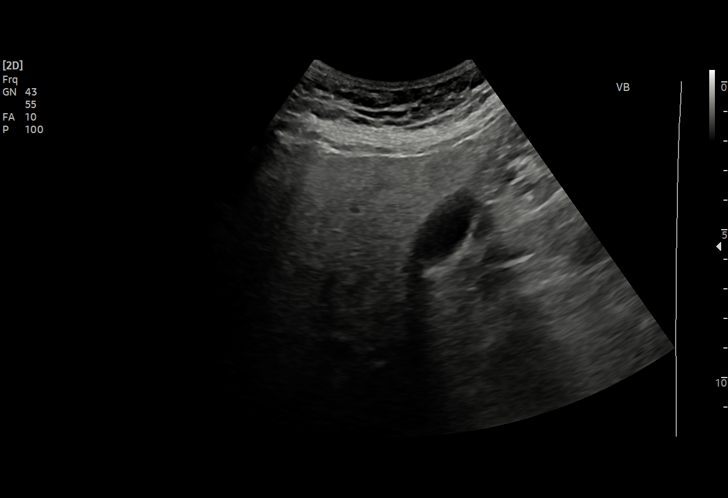
[im 11/49]
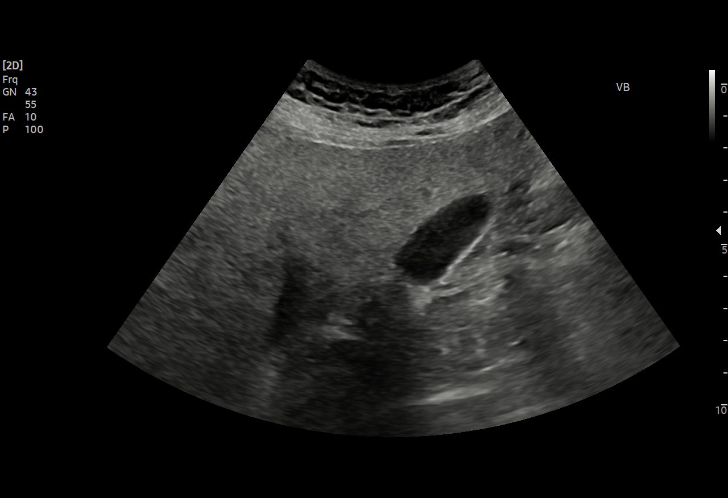
[im 15/49]
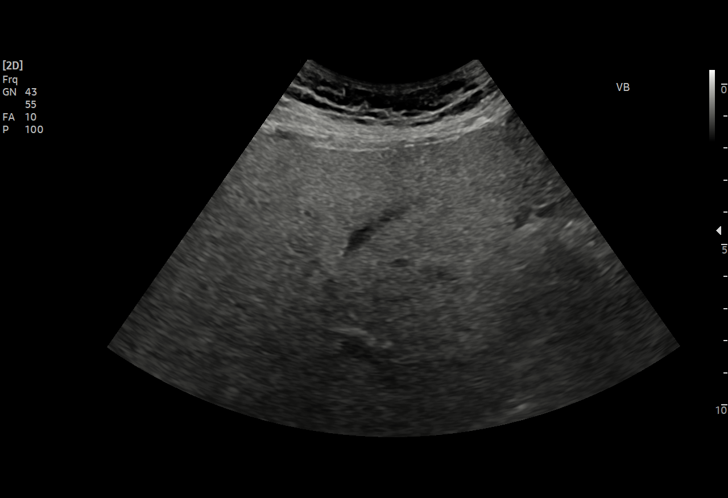
[im 19/49]
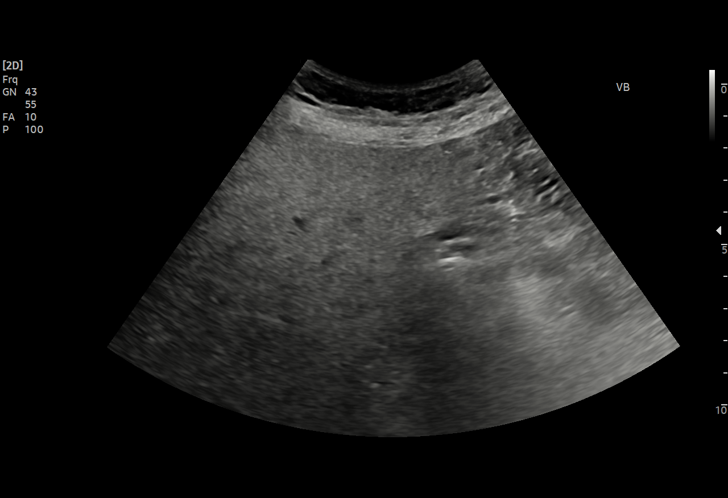
[im 21/49]
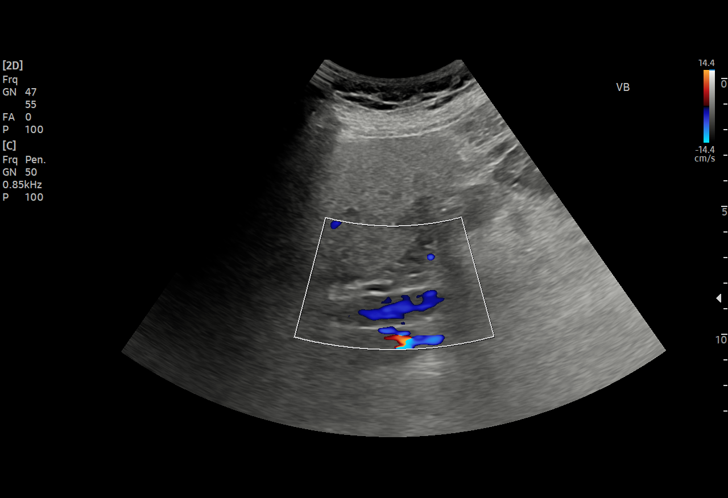
[im 25/49]
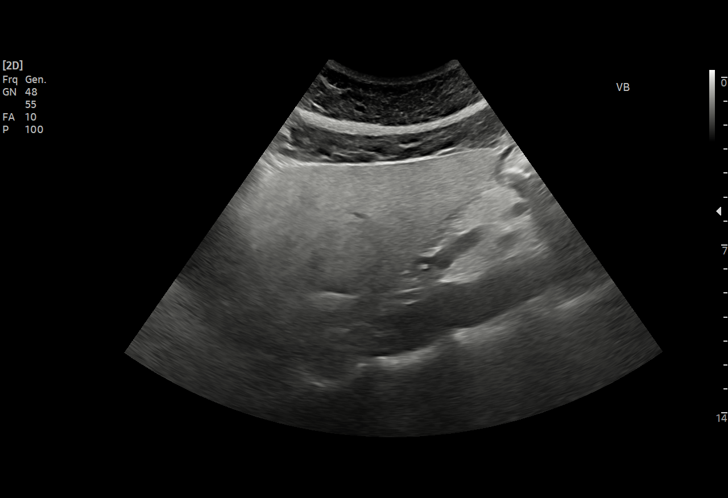
[im 29/49]
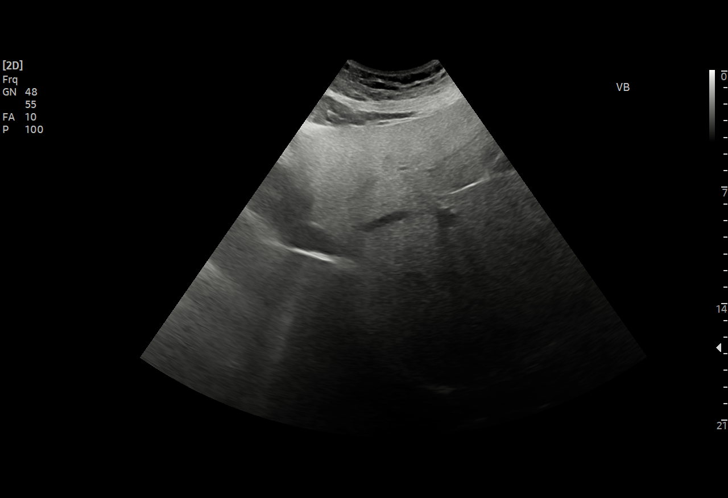
[im 31/49]
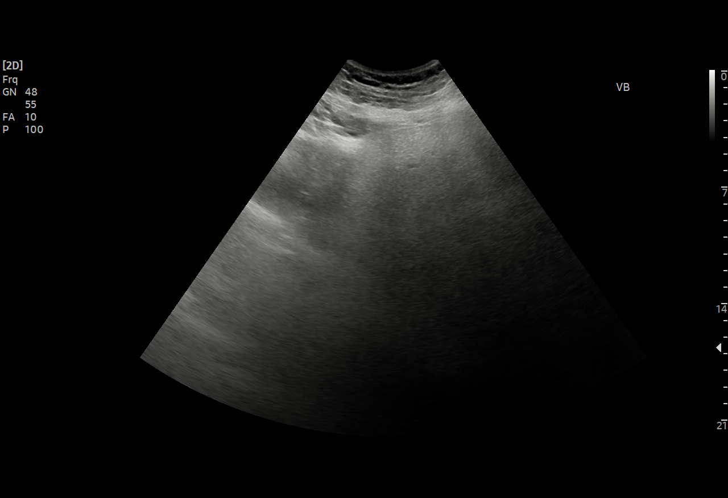
[im 35/49]
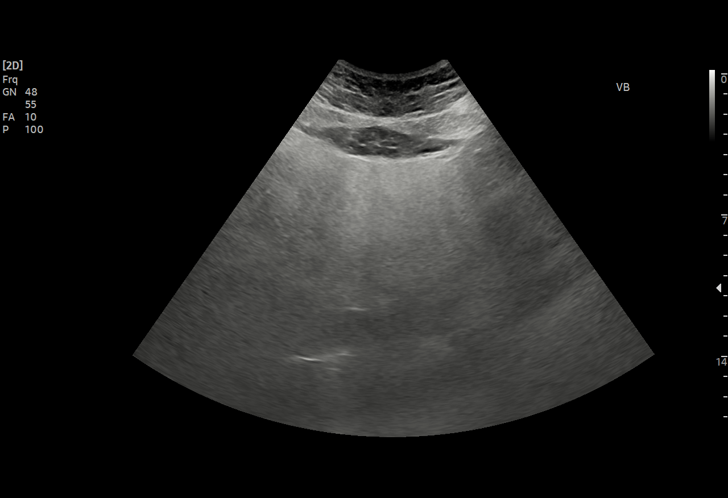
[im 39/49]
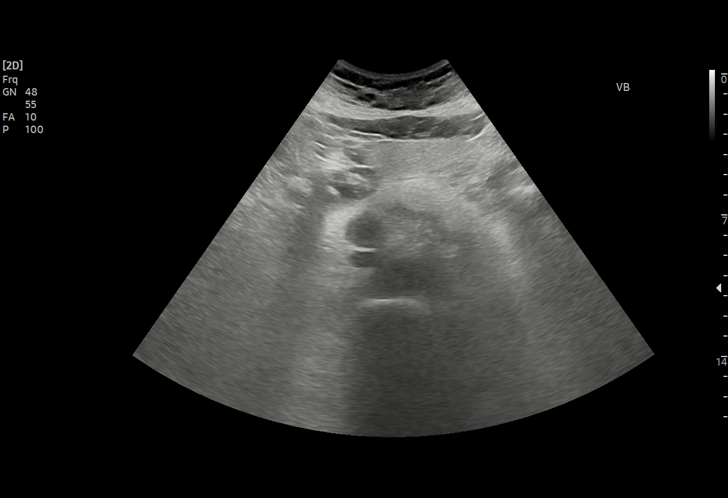
[im 41/49]
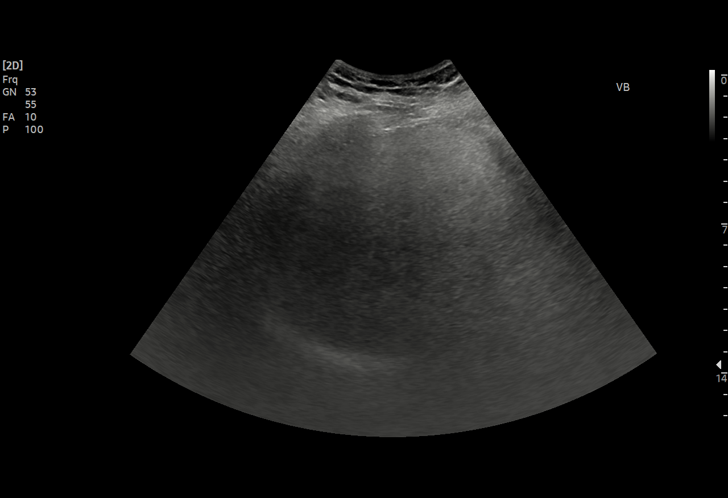
[im 45/49]
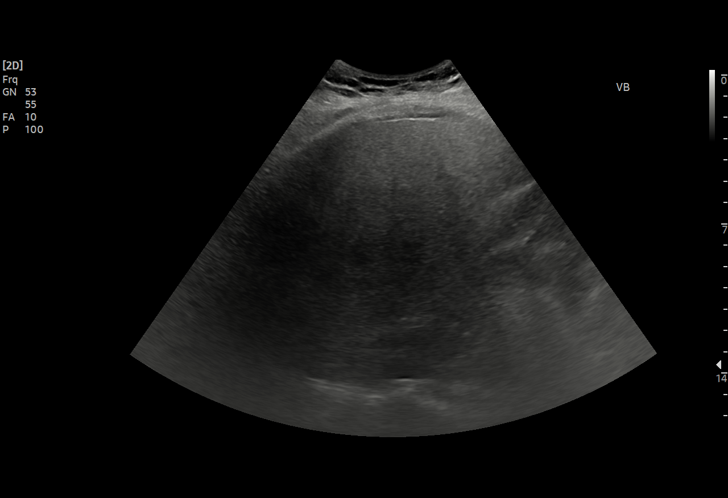
[im 49/49]
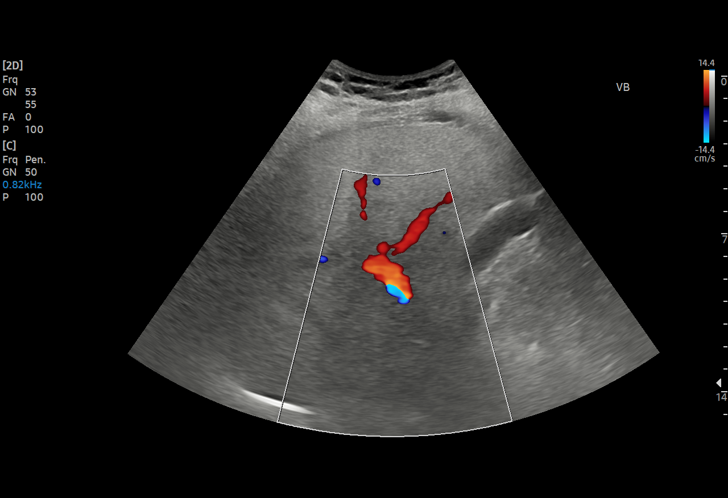

[15 of 25 positions shown; findings below may reference images not displayed]

FINDINGS: Gallbladder:

No gallstones or wall thickening visualized. No sonographic Murphy
sign noted by sonographer.

Common bile duct:

Diameter: 4.1 mm

Liver:

Diffusely echogenic. No focal hepatic abnormality. Portal vein is
patent on color Doppler imaging with normal direction of blood flow
towards the liver.

Other: None.
IMPRESSION: 1. Negative for gallstones.
2. Diffusely echogenic liver consistent with hepatic steatosis and
or hepatocellular disease

## 2023-10-26 ENCOUNTER — Other Ambulatory Visit: Payer: Self-pay | Admitting: Internal Medicine

## 2023-10-26 DIAGNOSIS — Z1231 Encounter for screening mammogram for malignant neoplasm of breast: Secondary | ICD-10-CM

## 2023-12-04 ENCOUNTER — Ambulatory Visit: Payer: No Typology Code available for payment source

## 2024-01-15 ENCOUNTER — Ambulatory Visit: Payer: No Typology Code available for payment source

## 2024-03-28 ENCOUNTER — Ambulatory Visit

## 2024-04-04 ENCOUNTER — Ambulatory Visit: Admitting: Cardiology
